# Patient Record
Sex: Female | Born: 1980 | Race: White | Hispanic: No | Marital: Married | Smoking: Former smoker
Health system: Southern US, Community
[De-identification: ages and names within clinical notes are randomized; demographics above are authoritative.]

## PROBLEM LIST (undated history)

## (undated) DIAGNOSIS — I82409 Acute embolism and thrombosis of unspecified deep veins of unspecified lower extremity: Secondary | ICD-10-CM

## (undated) DIAGNOSIS — N83519 Torsion of ovary and ovarian pedicle, unspecified side: Secondary | ICD-10-CM

## (undated) DIAGNOSIS — M329 Systemic lupus erythematosus, unspecified: Secondary | ICD-10-CM

## (undated) DIAGNOSIS — N200 Calculus of kidney: Secondary | ICD-10-CM

## (undated) DIAGNOSIS — D6861 Antiphospholipid syndrome: Secondary | ICD-10-CM

## (undated) DIAGNOSIS — IMO0002 Reserved for concepts with insufficient information to code with codable children: Secondary | ICD-10-CM

## (undated) DIAGNOSIS — I2692 Saddle embolus of pulmonary artery without acute cor pulmonale: Secondary | ICD-10-CM

## (undated) DIAGNOSIS — E063 Autoimmune thyroiditis: Secondary | ICD-10-CM

## (undated) DIAGNOSIS — Z8739 Personal history of other diseases of the musculoskeletal system and connective tissue: Secondary | ICD-10-CM

## (undated) DIAGNOSIS — E079 Disorder of thyroid, unspecified: Secondary | ICD-10-CM

## (undated) HISTORY — PX: OVARY SURGERY: SHX727

## (undated) HISTORY — PX: ABLATION ON ENDOMETRIOSIS: SHX5787

## (undated) HISTORY — PX: BACK SURGERY: SHX140

## (undated) HISTORY — PX: NECK SURGERY: SHX720

## (undated) HISTORY — PX: CHOLECYSTECTOMY: SHX55

## (undated) HISTORY — PX: TONSILLECTOMY: SUR1361

---

## 2000-09-02 ENCOUNTER — Encounter: Payer: Self-pay | Admitting: Family Medicine

## 2000-09-02 ENCOUNTER — Encounter: Admission: RE | Admit: 2000-09-02 | Discharge: 2000-09-02 | Payer: Self-pay | Admitting: Family Medicine

## 2000-10-17 ENCOUNTER — Emergency Department (HOSPITAL_COMMUNITY): Admission: EM | Admit: 2000-10-17 | Discharge: 2000-10-17 | Payer: Self-pay | Admitting: Emergency Medicine

## 2000-10-19 ENCOUNTER — Ambulatory Visit (HOSPITAL_COMMUNITY): Admission: RE | Admit: 2000-10-19 | Discharge: 2000-10-19 | Payer: Self-pay | Admitting: Gastroenterology

## 2000-10-21 ENCOUNTER — Encounter: Payer: Self-pay | Admitting: Gastroenterology

## 2000-10-21 ENCOUNTER — Ambulatory Visit (HOSPITAL_COMMUNITY): Admission: RE | Admit: 2000-10-21 | Discharge: 2000-10-21 | Payer: Self-pay | Admitting: Gastroenterology

## 2000-10-22 ENCOUNTER — Inpatient Hospital Stay (HOSPITAL_COMMUNITY): Admission: EM | Admit: 2000-10-22 | Discharge: 2000-10-23 | Payer: Self-pay | Admitting: Emergency Medicine

## 2000-10-22 ENCOUNTER — Encounter: Payer: Self-pay | Admitting: Gastroenterology

## 2005-07-31 ENCOUNTER — Ambulatory Visit: Payer: Self-pay | Admitting: *Deleted

## 2015-09-22 DIAGNOSIS — E063 Autoimmune thyroiditis: Secondary | ICD-10-CM

## 2015-09-22 HISTORY — DX: Autoimmune thyroiditis: E06.3

## 2016-10-20 ENCOUNTER — Encounter (HOSPITAL_BASED_OUTPATIENT_CLINIC_OR_DEPARTMENT_OTHER): Payer: Self-pay

## 2016-10-20 ENCOUNTER — Emergency Department (HOSPITAL_BASED_OUTPATIENT_CLINIC_OR_DEPARTMENT_OTHER)
Admission: EM | Admit: 2016-10-20 | Discharge: 2016-10-21 | Disposition: A | Discharge status: Home / Self Care | Payer: BLUE CROSS/BLUE SHIELD | Attending: Emergency Medicine | Admitting: Emergency Medicine

## 2016-10-20 ENCOUNTER — Emergency Department (HOSPITAL_BASED_OUTPATIENT_CLINIC_OR_DEPARTMENT_OTHER): Payer: BLUE CROSS/BLUE SHIELD

## 2016-10-20 DIAGNOSIS — R0602 Shortness of breath: Secondary | ICD-10-CM | POA: Insufficient documentation

## 2016-10-20 DIAGNOSIS — Z87891 Personal history of nicotine dependence: Secondary | ICD-10-CM | POA: Insufficient documentation

## 2016-10-20 DIAGNOSIS — Z79899 Other long term (current) drug therapy: Secondary | ICD-10-CM | POA: Diagnosis not present

## 2016-10-20 DIAGNOSIS — R109 Unspecified abdominal pain: Secondary | ICD-10-CM | POA: Diagnosis present

## 2016-10-20 DIAGNOSIS — N390 Urinary tract infection, site not specified: Secondary | ICD-10-CM | POA: Insufficient documentation

## 2016-10-20 HISTORY — DX: Torsion of ovary and ovarian pedicle, unspecified side: N83.519

## 2016-10-20 HISTORY — DX: Personal history of other diseases of the musculoskeletal system and connective tissue: Z87.39

## 2016-10-20 HISTORY — DX: Calculus of kidney: N20.0

## 2016-10-20 LAB — URINALYSIS, ROUTINE W REFLEX MICROSCOPIC
Bilirubin Urine: NEGATIVE
Glucose, UA: NEGATIVE mg/dL
Ketones, ur: NEGATIVE mg/dL
Nitrite: NEGATIVE
Protein, ur: 30 mg/dL — AB
Specific Gravity, Urine: 1.016 (ref 1.005–1.030)
pH: 7 (ref 5.0–8.0)

## 2016-10-20 LAB — PREGNANCY, URINE: Preg Test, Ur: NEGATIVE

## 2016-10-20 LAB — URINALYSIS, MICROSCOPIC (REFLEX)

## 2016-10-20 MED ORDER — ONDANSETRON HCL 4 MG/2ML IJ SOLN
4.0000 mg | Freq: Once | INTRAMUSCULAR | Status: AC
  Administered 2016-10-20: 4 mg via INTRAVENOUS
  Filled 2016-10-20: qty 2

## 2016-10-20 MED ORDER — KETOROLAC TROMETHAMINE 30 MG/ML IJ SOLN
30.0000 mg | Freq: Once | INTRAMUSCULAR | Status: AC
  Administered 2016-10-20: 30 mg via INTRAVENOUS
  Filled 2016-10-20: qty 1

## 2016-10-20 NOTE — ED Provider Notes (Signed)
[facility] Provider Note   CSN: [id number] Arrival date & time: 10/20/16  2255  By signing my name below, I, Nelwyn Salisbury, attest that this documentation has been prepared under the direction and in the presence of Glynn Octave, MD . Electronically Signed: Nelwyn Salisbury, Scribe. 10/20/2016. 11:17 PM.  History   Chief Complaint Chief Complaint  Patient presents with  . Flank Pain   HPI  HPI Comments:  Katrina Osborne is a 36 y.o. female with pmhx of kidney stones who presents to the Emergency Department complaining of sudden-onset, constant right flank. Pt describes her pain as pressure-like, beginning in her right flank and moving through the right side of her ribs and abdomen. Her pain is exacerbated on deep inhalation and by leaning to her affected side. She reports associated SOB, dysuria and diarrhea. Pt was seen recently by UC with a cough and SOB and diagnosed with bronchitis. She has been taking her prescribed abx with relief of her cough. Pt denies any hematuria, vomiting, fever, or appetite change.  She is currently on her period. Previous abdominal shx of cholecystectomy. She notes that she recently noticed an unexplained bruise on her lower extremities and that her feet feel colder than normal.  Past Medical History:  Diagnosis Date  . History of herniated intervertebral disc   . Kidney stone   . Ovarian torsion     There are no active problems to display for this patient.   Past Surgical History:  Procedure Laterality Date  . CHOLECYSTECTOMY    . TONSILLECTOMY      OB History    No data available       Home Medications    Prior to Admission medications   Medication Sig Start Date End Date Taking? Authorizing Provider  HYDROcodone-acetaminophen (NORCO/VICODIN) 5-325 MG tablet Take 1 tablet by mouth every 6 (six) hours as needed for moderate pain.   Yes Historical Provider, MD  omeprazole (PRILOSEC) 20 MG capsule Take 20 mg by mouth  daily.   Yes Historical Provider, MD    Family History No family history on file.  Social History Social History  Substance Use Topics  . Smoking status: Former Games developer  . Smokeless tobacco: Never Used  . Alcohol use No     Allergies   Morphine and related and Penicillins   Review of Systems Review of Systems  Constitutional: Negative for appetite change and fever.  Respiratory: Positive for shortness of breath.   Gastrointestinal: Positive for diarrhea. Negative for vomiting.  Genitourinary: Positive for dysuria and flank pain. Negative for hematuria.  All other systems reviewed and are negative.    Physical Exam Updated Vital Signs BP 109/90 (BP Location: Left Arm)   Pulse 80   Temp 98 F (36.7 C) (Oral)   Resp 19   Ht 5\' 6"  (1.676 m)   Wt 235 lb (106.6 kg)   LMP 10/18/2016   SpO2 99%   BMI 37.93 kg/m   Physical Exam  Constitutional: She is oriented to person, place, and time. She appears well-developed and well-nourished. No distress.  Uncomfortabel  HENT:  Head: Normocephalic and atraumatic.  Mouth/Throat: Oropharynx is clear and moist. No oropharyngeal exudate.  Eyes: Conjunctivae and EOM are normal. Pupils are equal, round, and reactive to light.  Neck: Normal range of motion. Neck supple.  No meningismus.  Cardiovascular: Normal rate, regular rhythm, normal heart sounds and intact distal pulses.   No murmur heard. Lateral rib tenderness  Pulmonary/Chest: Effort normal and breath  sounds normal. No respiratory distress. She exhibits tenderness.  Abdominal: Soft. There is tenderness. There is no rebound and no guarding.  Right flank tenderness. Mild right sided abdominal tenderness.  Musculoskeletal: Normal range of motion. She exhibits no edema or tenderness.  Neurological: She is alert and oriented to person, place, and time. No cranial nerve deficit. She exhibits normal muscle tone. Coordination normal.   5/5 strength throughout. CN 2-12 intact.Equal  grip strength.   Skin: Skin is warm.  Psychiatric: She has a normal mood and affect. Her behavior is normal.  Nursing note and vitals reviewed.    ED Treatments / Results  DIAGNOSTIC STUDIES:  Oxygen Saturation is 99% on RA, normal by my interpretation.    COORDINATION OF CARE:  11:23 PM Discussed treatment plan with pt at bedside which includes urinalysis and pt agreed to plan.  Labs (all labs ordered are listed, but only abnormal results are displayed) Labs Reviewed  URINALYSIS, ROUTINE W REFLEX MICROSCOPIC - Abnormal; Notable for the following:       Result Value   Hgb urine dipstick LARGE (*)    Protein, ur 30 (*)    Leukocytes, UA MODERATE (*)    All other components within normal limits  COMPREHENSIVE METABOLIC PANEL - Abnormal; Notable for the following:    Potassium 3.3 (*)    Glucose, Bld 104 (*)    ALT 11 (*)    All other components within normal limits  URINALYSIS, MICROSCOPIC (REFLEX) - Abnormal; Notable for the following:    Bacteria, UA FEW (*)    Squamous Epithelial / LPF 0-5 (*)    All other components within normal limits  URINE CULTURE  PREGNANCY, URINE  CBC WITH DIFFERENTIAL/PLATELET  LIPASE, BLOOD  D-DIMER, QUANTITATIVE (NOT AT [facility])    EKG  EKG Interpretation None       Radiology Dg Chest 2 View  Result Date: 10/20/2016 CLINICAL DATA:  Right lower rib pain for 3 days.  Frequent coughing. EXAM: CHEST  2 VIEW COMPARISON:  None. FINDINGS: The heart size and mediastinal contours are within normal limits. Both lungs are clear. The visualized skeletal structures are unremarkable. IMPRESSION: No active cardiopulmonary disease. Electronically Signed   By: Ellery Plunk M.D.   On: 10/20/2016 23:54   Ct Renal Stone Study  Result Date: 10/21/2016 CLINICAL DATA:  Acute onset of right flank pain, extending to the right upper and lower quadrants. Hematuria. Initial encounter. EXAM: CT ABDOMEN AND PELVIS WITHOUT CONTRAST TECHNIQUE: Multidetector CT  imaging of the abdomen and pelvis was performed following the standard protocol without IV contrast. COMPARISON:  None. FINDINGS: Lower chest: The visualized lung bases are grossly clear. The visualized portions of the mediastinum are unremarkable. Hepatobiliary: The liver is unremarkable in appearance. The patient is status post cholecystectomy, with clips noted at the gallbladder fossa. The common bile duct remains normal in caliber. Pancreas: The pancreas is within normal limits. Spleen: The spleen is unremarkable in appearance. Adrenals/Urinary Tract: The adrenal glands are unremarkable in appearance. The kidneys are within normal limits. There is no evidence of hydronephrosis. No renal or ureteral stones are identified. Mild left-sided perinephric stranding is noted. Stomach/Bowel: The stomach is unremarkable in appearance. The small bowel is within normal limits. The appendix is normal in caliber, without evidence of appendicitis. The colon is unremarkable in appearance. Vascular/Lymphatic: The abdominal aorta is unremarkable in appearance. The inferior vena cava is grossly unremarkable. No retroperitoneal lymphadenopathy is seen. No pelvic sidewall lymphadenopathy is identified. Reproductive: The bladder is  mildly distended and within normal limits. The uterus is grossly unremarkable in appearance. The ovaries are relatively symmetric. No suspicious adnexal masses are seen. Other: No additional soft tissue abnormalities are seen. Musculoskeletal: No acute osseous abnormalities are identified. Mild degenerative change is noted at L3-L4, with disc space narrowing and disc osteophyte complexes. The visualized musculature is unremarkable in appearance. IMPRESSION: No acute abnormality seen within the abdomen or pelvis. No evidence of hydronephrosis. No renal or ureteral stones seen at this time. Electronically Signed   By: Roanna RaiderJeffery  Chang M.D.   On: 10/21/2016 01:40    Procedures Procedures (including critical  care time)  Medications Ordered in ED Medications - No data to display   Initial Impression / Assessment and Plan / ED Course  I have reviewed the triage vital signs and the nursing notes.  Pertinent labs & imaging results that were available during my care of the patient were reviewed by me and considered in my medical decision making (see chart for details).     Flank pain that is pleuritic.  Hematuria but on menstrual cycle.   UA with RBCs and WBCs.  HCG negative. Will send culture.  CXR negative.  D-dimer negative, doubt PE. CT shows no kidney stone. No other acute pathology.   Rocephin given for UTI, culture sent.  Treat for UTI, possible pyelonephritis.  May have passed kidney stone. Also consider soreness from coughing and recent URI.  Consider zoster with rash not yet visible.  Pain improved. Followup with PCP.  Continue antibiotics. Return precautions discussed.   Final Clinical Impressions(s) / ED Diagnoses   Final diagnoses:  Flank pain  Urinary tract infection without hematuria, site unspecified    New Prescriptions New Prescriptions   No medications on file  I personally performed the services described in this documentation, which was scribed in my presence. The recorded information has been reviewed and is accurate.     Glynn OctaveStephen Rajanee Schuelke, MD 10/21/16 [phone]

## 2016-10-20 NOTE — ED Triage Notes (Signed)
Pt had bronchitis 5 days ago and has had back pain since then from coughing, but yesterday the pain got much worse with nausea and pain radiating to RLQ.  At that time she was having difficulty urinating.  Today her pain moved into her RUQ, right chest as well.  Pt is able to speak in full sentences, no SOB with walking.

## 2016-10-21 ENCOUNTER — Emergency Department (HOSPITAL_BASED_OUTPATIENT_CLINIC_OR_DEPARTMENT_OTHER): Payer: BLUE CROSS/BLUE SHIELD

## 2016-10-21 LAB — CBC WITH DIFFERENTIAL/PLATELET
BASOS ABS: 0.1 10*3/uL (ref 0.0–0.1)
BASOS PCT: 1 %
EOS ABS: 0.1 10*3/uL (ref 0.0–0.7)
Eosinophils Relative: 2 %
HCT: 36.3 % (ref 36.0–46.0)
HEMOGLOBIN: 12.4 g/dL (ref 12.0–15.0)
Lymphocytes Relative: 32 %
Lymphs Abs: 2.9 10*3/uL (ref 0.7–4.0)
MCH: 28.4 pg (ref 26.0–34.0)
MCHC: 34.2 g/dL (ref 30.0–36.0)
MCV: 83.1 fL (ref 78.0–100.0)
MONOS PCT: 11 %
Monocytes Absolute: 0.9 10*3/uL (ref 0.1–1.0)
NEUTROS PCT: 54 %
Neutro Abs: 4.8 10*3/uL (ref 1.7–7.7)
Platelets: 208 10*3/uL (ref 150–400)
RBC: 4.37 MIL/uL (ref 3.87–5.11)
RDW: 13 % (ref 11.5–15.5)
WBC: 8.8 10*3/uL (ref 4.0–10.5)

## 2016-10-21 LAB — COMPREHENSIVE METABOLIC PANEL
ALBUMIN: 3.9 g/dL (ref 3.5–5.0)
ALK PHOS: 64 U/L (ref 38–126)
ALT: 11 U/L — ABNORMAL LOW (ref 14–54)
ANION GAP: 8 (ref 5–15)
AST: 19 U/L (ref 15–41)
BUN: 12 mg/dL (ref 6–20)
CO2: 22 mmol/L (ref 22–32)
Calcium: 8.9 mg/dL (ref 8.9–10.3)
Chloride: 109 mmol/L (ref 101–111)
Creatinine, Ser: 0.8 mg/dL (ref 0.44–1.00)
GFR calc Af Amer: >60 mL/min (ref >60)
GFR calc non Af Amer: >60 mL/min (ref >60)
GLUCOSE: 104 mg/dL — AB (ref 65–99)
POTASSIUM: 3.3 mmol/L — AB (ref 3.5–5.1)
SODIUM: 139 mmol/L (ref 135–145)
Total Bilirubin: 0.5 mg/dL (ref 0.3–1.2)
Total Protein: 7.1 g/dL (ref 6.5–8.1)

## 2016-10-21 LAB — D-DIMER, QUANTITATIVE: D-Dimer, Quant: 0.42 ug/mL-FEU (ref 0.00–0.50)

## 2016-10-21 LAB — LIPASE, BLOOD: Lipase: 33 U/L (ref 11–51)

## 2016-10-21 MED ORDER — DEXTROSE 5 % IV SOLN
1.0000 g | Freq: Once | INTRAVENOUS | Status: AC
  Administered 2016-10-21: 1 g via INTRAVENOUS
  Filled 2016-10-21: qty 10

## 2016-10-21 MED ORDER — CEPHALEXIN 500 MG PO CAPS: 500.0000 mg | ORAL_CAPSULE | Freq: Four times a day (QID) | ORAL | 0 refills | Status: DC

## 2016-10-21 MED ORDER — NAPROXEN 500 MG PO TABS: 500.0000 mg | ORAL_TABLET | Freq: Two times a day (BID) | ORAL | 0 refills | Status: DC

## 2016-10-21 NOTE — ED Notes (Signed)
Patient transported to CT 

## 2016-10-21 NOTE — ED Notes (Signed)
Pt verbalizes understanding of d/c instructions and denies any further need at this time. 

## 2016-10-21 NOTE — Discharge Instructions (Signed)
There is no pneumonia, blood clot or kidney stone. Take the antibiotics for a UTI. Watch for development of a rash on your skin as this could be early shingles. Return to the ED if you develop new or worsening symptoms.

## 2016-10-22 LAB — URINE CULTURE: Culture: <10000 — AB

## 2017-04-19 ENCOUNTER — Encounter: Payer: Self-pay | Admitting: Physical Therapy

## 2017-04-19 ENCOUNTER — Ambulatory Visit: Payer: Commercial Managed Care - PPO | Attending: Neurological Surgery | Admitting: Physical Therapy

## 2017-04-19 DIAGNOSIS — M545 Low back pain, unspecified: Secondary | ICD-10-CM

## 2017-04-19 NOTE — Therapy (Signed)
[facility] [address] [place], [place], [address] Phone: [phone]   Fax:  [phone]  Physical Therapy Evaluation  Patient Details  Name: Katrina Osborne MRN: [id number] Date of Birth: 02/08/1981 Referring Provider: Gae DryGregory Howes MD  Encounter Date: 04/19/2017      PT End of Session - 04/19/17 1339    Visit Number 1   Number of Visits 12   Date for PT Re-Evaluation 05/31/17   PT Start Time 0101   PT Stop Time 0151   PT Time Calculation (min) 50 min   Activity Tolerance Patient tolerated treatment well   Behavior During Therapy [facility] for tasks assessed/performed      Past Medical History:  Diagnosis Date  . History of herniated intervertebral disc   . Kidney stone   . Ovarian torsion     Past Surgical History:  Procedure Laterality Date  . CHOLECYSTECTOMY    . TONSILLECTOMY      There were no vitals filed for this visit.       Subjective Assessment - 04/19/17 1331    Subjective The patient presents to OPPT s/p lumbar surgery performed on 02/22/17.  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  She has tried to do some yardwork but this increased her pain.  Her pain at rest today is a 2/10.     Pertinent History H/o low back pain.   Patient Stated Goals Get out of pain and get strong again.   Currently in Pain? Yes   Pain Score 2    Pain Location Back   Pain Orientation Right   Pain Descriptors / Indicators Aching   Pain Type Surgical pain   Pain Onset More than a month ago   Pain Frequency Constant   Aggravating Factors  See above.   Pain Relieving Factors See above.            [facility] PT Assessment - 04/19/17 0001      Assessment   Medical Diagnosis Lumbar surgery L3 to L5.   Referring Provider Gae DryGregory Howes MD   Onset Date/Surgical Date --  02/22/17 (surgery date).     Precautions   Precautions --  Pain-free spinal exercises.  No heat.     Restrictions    Weight Bearing Restrictions No     Balance Screen   Has the patient fallen in the past 6 months Yes   How many times? --  1.   Has the patient had a decrease in activity level because of a fear of falling?  No   Is the patient reluctant to leave their home because of a fear of falling?  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     Strength   Overall Strength Comments Normal bilateral LE strength.     Palpation   Palpation comment Tender with increased tone on either side of the patient's lumbar incision.     Special Tests    Special Tests --  =leg lengths.  Absent bilateral LE DTR's.     Transfers   Transfers --  WNL.     Ambulation/Gait   Gait Comments WNL.            Objective measurements completed on examination: See above findings.          [facility] Adult PT Treatment/Exercise - 04/19/17 0001      Modalities   Modalities Electrical Stimulation;Moist Heat     Moist Heat Therapy   Number Minutes Moist Heat 15 Minutes   Moist Heat Location Lumbar Spine     Electrical Stimulation   Electrical Stimulation Location Bilateral lumbar.   Electrical Stimulation Action Pre-mod.   Electrical Stimulation Parameters 80-150 Hz x 15 minutes.   Electrical Stimulation Goals Tone;Pain                             Plan - 04/19/17 1339    Clinical Impression Statement The patient is s/p lumbar surgery at level 3 to 5.  She is doing well but reports continued pain with ADL's.  Patient is pleased with surgery as it has decreased her right LE pain.  Patient will benefit from skilled physical therapy to include a comprehensive core stabilization program.   History  and Personal Factors relevant to plan of care: h/o low back pain.   Clinical Presentation Stable   Clinical Presentation due to: Good surgical outcome.   Clinical Decision Making Low   Rehab Potential Excellent   PT Frequency 2x / week   PT Duration 6 weeks   PT Treatment/Interventions ADLs/Self Care Home Management;Cryotherapy;Electrical Stimulation;Ultrasound;Therapeutic activities;Therapeutic exercise;Neuromuscular re-education;Patient/family education;Manual techniques   PT Next Visit Plan No heat.  Please see lumbar discectomy protocol.  e'stim.   Consulted and Agree with Plan of Care Patient      Patient will benefit from skilled therapeutic intervention in order to improve the following deficits and impairments:  Pain, Decreased activity tolerance  Visit Diagnosis: Acute bilateral low back pain without sciatica - Plan: PT plan of care cert/re-cert     Problem List There are no active problems to display for this patient.   Keanthony Poole, [place] MPT 04/19/2017, 2:10 PM  [facility] [address] [place], [place], [address] Phone: [phone]   Fax:  [phone]  Name: Katrina Osborne MRN: [id number] Date of Birth: 11/26/1980

## 2017-04-21 ENCOUNTER — Encounter: Payer: BLUE CROSS/BLUE SHIELD | Admitting: Physical Therapy

## 2017-04-23 ENCOUNTER — Ambulatory Visit: Payer: Commercial Managed Care - PPO | Attending: Neurological Surgery | Admitting: *Deleted

## 2017-04-23 DIAGNOSIS — M545 Low back pain, unspecified: Secondary | ICD-10-CM

## 2017-04-23 NOTE — Therapy (Signed)
[facility] [address] [place], [place], [address] Phone: [phone]   Fax:  [phone]  Physical Therapy Treatment  Patient Details  Name: Katrina Osborne MRN: [id number] Date of Birth: 11/22/1980 Referring Provider: Gae DryGregory Howes MD  Encounter Date: 04/23/2017      PT End of Session - 04/23/17 1037    Visit Number 2   Number of Visits 12   Date for PT Re-Evaluation 05/31/17   PT Start Time 1030   PT Stop Time 1120   PT Time Calculation (min) 50 min      Past Medical History:  Diagnosis Date  . History of herniated intervertebral disc   . Kidney stone   . Ovarian torsion     Past Surgical History:  Procedure Laterality Date  . CHOLECYSTECTOMY    . TONSILLECTOMY      There were no vitals filed for this visit.                       [facility] Adult PT Treatment/Exercise - 04/23/17 0001      Exercises   Exercises Lumbar;Knee/Hip     Lumbar Exercises: Supine   Ab Set 20 reps;5 seconds   Bent Knee Raise 10 reps;3 seconds  with drawin   Bridge 10 reps  with drawin     Lumbar Exercises: Prone   Straight Leg Raise 10 reps;2 seconds  with drawin     Modalities   Modalities Electrical Stimulation;Moist Heat     Electrical Stimulation   Electrical Stimulation Location Bilateral lumbar.Premod x 15 mins 80-150hz   Try  IFC next Rx   Electrical Stimulation Goals Tone;Pain       Bike x 10 mins L1           PT Education - 04/23/17 1049    Education provided Yes                    Plan - 04/23/17 1039    Clinical Impression Statement Pt arrived today doing fairly well with mainly LB soreness. Rx focused on core activation exs and an HEP was provided. Pt did well with  therex with  prone leg raise being the most challenging. Pt had a normal response to Estim, but IFC may be more tolerable.   Clinical Decision Making Low   Rehab Potential Excellent   PT Frequency 2x / week   PT Duration 6 weeks   PT Treatment/Interventions ADLs/Self Care Home Management;Cryotherapy;Electrical Stimulation;Ultrasound;Therapeutic activities;Therapeutic exercise;Neuromuscular re-education;Patient/family education;Manual techniques   PT Next Visit Plan No heat.  Please see lumbar discectomy protocol.  e'stim.  IFC   Consulted and Agree with Plan of Care Patient      Patient will benefit from skilled therapeutic intervention in order to improve the following deficits and impairments:  Pain, Decreased activity tolerance  Visit Diagnosis: Acute bilateral low back pain without sciatica     Problem List There are no active problems to display for this patient.   Brycelyn Gambino,CHRIS, PTA 04/23/2017, 12:44 PM  [facility] [address] [place], [place], [address] Phone: [phone]   Fax:  [phone]  Name: Katrina Osborne MRN: [id number] Date of Birth: 12/21/1980

## 2017-04-23 NOTE — Patient Instructions (Addendum)
Isometric Abdominal   Lying on back with knees bent, tighten stomach by pulling navel down. Hold ____ seconds. Repeat __5__ times per set. Do __5__ sets per session. Do _3-5___ sessions per day.  http://orth.exer.us/1086   Copyright  VHI. All rights reserved.  Bent Leg Lift (Hook-Lying)   Tighten stomach and slowly raise right leg _6___ inches from floor. Keep trunk rigid. Hold _2-3___ seconds. Repeat _10___ times per set. Do _3___ sets per session. Do _2-3___ sessions per day.  http://orth.exer.us/1090   Copyright  VHI. All rights reserved.  Bridging   Slowly raise buttocks from floor, keeping stomach tight. Repeat __10__ times per set. Do _3___ sets per session. Do __2-3__ sessions per day.  http://orth.exer.us/1096   Copyright  VHI. All rights reserved.  Isometric Abdominal   Lying on back with knees bent, tighten stomach by pulling navel down. Hold ____ seconds. Repeat __5__ times per set. Do __5__ sets per session. Do _3-5___ sessions per day.  http://orth.exer.us/1086   Copyright  VHI. All rights reserved.  Bent Leg Lift (Hook-Lying)   Tighten stomach and slowly raise right leg _6___ inches from floor. Keep trunk rigid. Hold _2-3___ seconds. Repeat _10___ times per set. Do _3___ sets per session. Do _2-3___ sessions per day.  http://orth.exer.us/1090   Copyright  VHI. All rights reserved.  Bridging   Slowly raise buttocks from floor, keeping stomach tight. Repeat __10__ times per set. Do _3___ sets per session. Do __2-3__ sessions per day.  http://orth.exer.us/1096   Copyright  VHI. All rights reserved.  Bridging   Slowly raise buttocks from floor, keeping stomach tight. Repeat ____ times per set. Do ____ sets per session. Do ____ sessions per day.  http://orth.exer.us/1096   Copyright  VHI. All rights reserved.  Straight Leg Raise (Prone)   Abdomen and head supported, keep left knee locked and raise leg at hip. Avoid arching low  back. Repeat _10___ times per set. Do _3___ sets per session. Do _2-3___ sessions per day.  http://orth.exer.us/1112   Copyright  VHI. All rights reserved.  Opposite Arm / Leg Lift (Prone)   Abdomen and head supported, left knee locked, raise leg and opposite arm ____ inches from floor. Repeat _10___ times per set. Do 3____ sets per session. Do __2-3__ sessions per day.  http://orth.exer.us/1114   Copyright  VHI. All rights reserved.  Combination (Hook-Lying)   Tighten stomach and slowly raise left leg and lower opposite arm over head. Keep trunk rigid. Repeat _10___ times per set. Do _3___ sets per session. Do _2-3___ sessions per day.  http://orth.exer.us/1092   Copyright  VHI. All rights reserved.  Upper / Lower Extremity Extension (All-Fours)   Tighten stomach and raise right leg and opposite arm. Keep trunk rigid. Repeat __10__ times per set. Do __3__ sets per session. Do _2-3___ sessions per day.  http://orth.exer.us/1118   Copyright  VHI. All rights reserved.

## 2017-04-27 ENCOUNTER — Ambulatory Visit: Payer: Commercial Managed Care - PPO | Admitting: *Deleted

## 2017-04-27 DIAGNOSIS — M545 Low back pain, unspecified: Secondary | ICD-10-CM

## 2017-04-27 NOTE — Therapy (Signed)
[facility] [address] [place], [place], [address] Phone: [phone]   Fax:  [phone]  Physical Therapy Treatment  Patient Details  Name: Katrina Osborne MRN: [id number] Date of Birth: [birth year] Referring Provider: Gae Dry MD  Encounter Date: 04/27/2017      PT End of Session - 04/27/17 1047    Visit Number 3   Number of Visits 12   Date for PT Re-Evaluation 05/31/17   PT Start Time 1032   PT Stop Time 1123   PT Time Calculation (min) 51 min      Past Medical History:  Diagnosis Date  . History of herniated intervertebral disc   . Kidney stone   . Ovarian torsion     Past Surgical History:  Procedure Laterality Date  . CHOLECYSTECTOMY    . TONSILLECTOMY      There were no vitals filed for this visit.      Subjective Assessment - 04/27/17 1039    Subjective The patient presents to OPPT s/p lumbar surgery performed on 02/22/17.  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  She has tried to do some yardwork but this increased her pain.  Her pain at rest today is a 2/10.     Pertinent History H/o low back pain.   Patient Stated Goals Get out of pain and get strong again.   Currently in Pain? Yes   Pain Score 3    Pain Location Back   Pain Orientation Right   Pain Descriptors / Indicators Aching   Pain Type Surgical pain   Pain Onset More than a month ago                         [facility] Adult PT Treatment/Exercise - 04/27/17 0001      Exercises   Exercises Lumbar;Knee/Hip     Lumbar Exercises: Supine   Ab Set 20 reps;5 seconds   Bent Knee Raise 10 reps;3 seconds  with drawin   Bridge 10 reps  with drawin     Lumbar Exercises: Prone   Straight Leg Raise 10 reps;2 seconds  with drawin     Lumbar Exercises: Quadruped   Madcat/Old Horse 10 reps     Knee/Hip Exercises: Standing   Rocker Board 5 minutes  DF/PF, calf stretch,  balance     Knee/Hip Exercises: Seated   Sit to Sand 10 reps  power lift with Red ball for technique     Modalities   Modalities Electrical Stimulation;Moist Heat     Electrical Stimulation   Electrical Stimulation Location Bilateral lumbar.Premod x 15 mins 80-150hz    Electrical Stimulation Goals Tone;Pain                            Plan - 04/27/17 1048    Clinical Impression Statement Pt arrived today doing fairly well just soreness in LB. HEP was reviewed with verbal and tactile cues needed. Cat and Camel were performed and added to HEP. The hinge bend and power position were also performed. Pt needed verbal and tactile cues to help with hinge bending.   Rehab Potential Excellent   PT Frequency 2x / week   PT Duration 6 weeks   PT Treatment/Interventions ADLs/Self Care Home Management;Cryotherapy;Electrical Stimulation;Ultrasound;Therapeutic activities;Therapeutic exercise;Neuromuscular re-education;Patient/family education;Manual techniques   PT Next Visit Plan No heat.  Please see lumbar  discectomy protocol.  e'stim.  IFC   Consulted and Agree with Plan of Care Patient      Patient will benefit from skilled therapeutic intervention in order to improve the following deficits and impairments:  Pain, Decreased activity tolerance  Visit Diagnosis: Acute bilateral low back pain without sciatica     Problem List There are no active problems to display for this patient.   Frona Yost,CHRIS, PTA 04/27/2017, 12:14 PM  [facility] [address] [place], [place], [address] Phone: [phone]   Fax:  [phone]  Name: Cecilie LowersCynthia Michele Limpert MRN: [id number] Date of Birth: 09/04/1981

## 2017-04-29 ENCOUNTER — Encounter: Payer: BLUE CROSS/BLUE SHIELD | Admitting: *Deleted

## 2017-04-30 ENCOUNTER — Ambulatory Visit: Payer: Commercial Managed Care - PPO | Admitting: *Deleted

## 2017-04-30 DIAGNOSIS — M545 Low back pain, unspecified: Secondary | ICD-10-CM

## 2017-04-30 NOTE — Therapy (Signed)
[facility] [address] [place], [place], [address] Phone: [phone]   Fax:  [phone]  Physical Therapy Treatment  Patient Details  Name: Katrina Osborne MRN: [id number] Date of Birth: [birth year] Referring Provider: Gae Dry MD  Encounter Date: 04/30/2017      PT End of Session - 04/30/17 1038    Visit Number 4   Number of Visits 12   Date for PT Re-Evaluation 05/31/17   PT Start Time 1032   PT Stop Time 1119   PT Time Calculation (min) 47 min      Past Medical History:  Diagnosis Date  . History of herniated intervertebral disc   . Kidney stone   . Ovarian torsion     Past Surgical History:  Procedure Laterality Date  . CHOLECYSTECTOMY    . TONSILLECTOMY      There were no vitals filed for this visit.      Subjective Assessment - 04/30/17 1033    Subjective LBP doing ok and did well after last Rx. Not feeling good due to respirtory problems.   Pertinent History H/o low back pain.   Patient Stated Goals Get out of pain and get strong again.   Currently in Pain? Yes   Pain Score 2    Pain Location Back   Pain Orientation Right   Pain Descriptors / Indicators Aching   Pain Type Surgical pain   Pain Onset More than a month ago   Pain Frequency Constant                         [facility] Adult PT Treatment/Exercise - 04/30/17 0001      Exercises   Exercises Lumbar;Knee/Hip     Lumbar Exercises: Standing   Shoulder Extension AROM;Both;20 reps  XTS pink   core activation     Lumbar Exercises: Supine   Ab Set 10 reps;5 seconds   Bent Knee Raise 10 reps;3 seconds  with drawin   Bridge 10 reps  with drawin     Lumbar Exercises: Prone   Straight Leg Raise 10 reps;2 seconds     Lumbar Exercises: Quadruped   Madcat/Old Horse 10 reps     Knee/Hip Exercises: Seated   Sit to Sand 20 reps  power lift with Red ball for technique     Modalities   Modalities Electrical  Stimulation;Moist Heat     Electrical Stimulation   Electrical Stimulation Location Bilateral lumbar.Premod x 15 mins 80-150hz    Electrical Stimulation Goals Tone;Pain                            Plan - 04/30/17 1238    Clinical Impression Statement Pt arrived today not feeling well due to respiratory problems. She did well with core activation exs and had less pain. Prone leg raise was easier , but still the most challenging especially RT side. We also reviewed the power position and hinge bending, for body mechanics. She still requires verbal and tactile cues for technique.Marland Kitchen STGs ongoing   Clinical Presentation Stable   Clinical Decision Making Low   Rehab Potential Excellent   PT Frequency 2x / week   PT Duration 6 weeks   PT Treatment/Interventions ADLs/Self Care Home Management;Cryotherapy;Electrical Stimulation;Ultrasound;Therapeutic activities;Therapeutic exercise;Neuromuscular re-education;Patient/family education;Manual techniques   PT Next Visit Plan No heat.  Please see lumbar discectomy protocol.  e'stim.  IFC   Consulted and Agree with Plan of  Care Patient      Patient will benefit from skilled therapeutic intervention in order to improve the following deficits and impairments:  Pain, Decreased activity tolerance  Visit Diagnosis: Acute bilateral low back pain without sciatica     Problem List There are no active problems to display for this patient.   Kelicia Youtz,CHRIS, PTA 04/30/2017, 12:49 PM  [facility] [address] [place], [place], [address] Phone: [phone]   Fax:  [phone]  Name: Cecilie LowersCynthia Michele Lichtenwalner MRN: [id number] Date of Birth: 05/16/1981

## 2017-05-04 ENCOUNTER — Encounter: Payer: BLUE CROSS/BLUE SHIELD | Admitting: *Deleted

## 2017-05-06 ENCOUNTER — Encounter: Payer: BLUE CROSS/BLUE SHIELD | Admitting: *Deleted

## 2017-05-07 ENCOUNTER — Ambulatory Visit: Payer: Commercial Managed Care - PPO | Admitting: *Deleted

## 2017-05-10 ENCOUNTER — Ambulatory Visit: Payer: Commercial Managed Care - PPO | Admitting: Physical Therapy

## 2017-05-10 ENCOUNTER — Encounter: Payer: Self-pay | Admitting: Physical Therapy

## 2017-05-10 DIAGNOSIS — M545 Low back pain, unspecified: Secondary | ICD-10-CM

## 2017-05-10 NOTE — Therapy (Signed)
[facility] [address] [place], [place], [address] Phone: [phone]   Fax:  [phone]  Physical Therapy Treatment  Patient Details  Name: Katrina Osborne MRN: [id number] Date of Birth: January 09, 1981 Referring Provider: Gae Dry MD  Encounter Date: 05/10/2017      PT End of Session - 05/10/17 1525    Visit Number 5   Number of Visits 12   Date for PT Re-Evaluation 05/31/17   PT Start Time 0145   PT Stop Time 0237   PT Time Calculation (min) 52 min   Activity Tolerance Patient tolerated treatment well   Behavior During Therapy [facility] for tasks assessed/performed      Past Medical History:  Diagnosis Date  . History of herniated intervertebral disc   . Kidney stone   . Ovarian torsion     Past Surgical History:  Procedure Laterality Date  . CHOLECYSTECTOMY    . TONSILLECTOMY      There were no vitals filed for this visit.      Subjective Assessment - 05/10/17 1524    Subjective I'm feeling better today.   Pain Score 2    Pain Location Back   Pain Orientation Right   Pain Descriptors / Indicators Aching   Pain Type Surgical pain   Pain Onset More than a month ago                         [facility] Adult PT Treatment/Exercise - 05/10/17 0001      Exercises   Exercises Knee/Hip     Lumbar Exercises: Aerobic   Stationary Bike Nustep level 3 with a cadence of 70 steps/min. x 15 minutes.     Lumbar Exercises: Machines for Strengthening   Cybex Knee Extension 10# x 4 minutes.   Cybex Knee Flexion 30# x 4 minutes.     Modalities   Modalities Doctor, general practice Location Lumbar.   Electrical Stimulation Action IFC   Electrical Stimulation Parameters 80-150 Hz x 20 minutes at 80-150 Hz.   Electrical Stimulation Goals Tone;Pain                     PT Long Term Goals - 05/10/17 1528      PT LONG TERM GOAL #1   Title  Ind. with a HEP.   Time 6   Period Weeks   Status New     PT LONG TERM GOAL #2   Title Perform ADL's with pain not > 2/10.   Time 6   Period Weeks   Status New     PT LONG TERM GOAL #3   Title Walk a community distance with pain not > 2-3/10.   Time 6   Period Weeks   Status New               Plan - 05/10/17 1529    Clinical Impression Statement Patient did an excellent job with aditional exercise today.  No compliants.   PT Treatment/Interventions ADLs/Self Care Home Management;Cryotherapy;Electrical Stimulation;Ultrasound;Therapeutic activities;Therapeutic exercise;Neuromuscular re-education;Patient/family education;Manual techniques   PT Next Visit Plan Add mini-squats against plexiglass.      Patient will benefit from skilled therapeutic intervention in order to improve the following deficits and impairments:     Visit Diagnosis: Acute bilateral low back pain without sciatica     Problem List There are no active problems to display for this patient.  Aren Cherne, [place] MPT 05/10/2017, 3:31 PM  [facility] [address] [place], [place], [address] Phone: [phone]   Fax:  [phone]  Name: Anariah Phillip MRN: [id number] Date of Birth: [birth year]

## 2017-05-11 ENCOUNTER — Encounter: Payer: BLUE CROSS/BLUE SHIELD | Admitting: *Deleted

## 2017-05-13 ENCOUNTER — Encounter: Payer: BLUE CROSS/BLUE SHIELD | Admitting: *Deleted

## 2017-05-14 ENCOUNTER — Ambulatory Visit: Payer: Commercial Managed Care - PPO | Admitting: Physical Therapy

## 2017-05-14 ENCOUNTER — Encounter: Payer: BLUE CROSS/BLUE SHIELD | Admitting: *Deleted

## 2017-05-14 DIAGNOSIS — M545 Low back pain, unspecified: Secondary | ICD-10-CM

## 2017-05-14 NOTE — Therapy (Signed)
[facility] [address] [place], [place], [address] Phone: [phone]   Fax:  [phone]  Physical Therapy Treatment  Patient Details  Name: Katrina Osborne MRN: [id number] Date of Birth: [birth year] Referring Provider: Gae Dry MD  Encounter Date: 05/14/2017      PT End of Session - 05/14/17 1252    Visit Number 6   Number of Visits 12   Date for PT Re-Evaluation 05/31/17   PT Start Time 1155   PT Stop Time 1237   PT Time Calculation (min) 42 min   Activity Tolerance Patient tolerated treatment well   Behavior During Therapy [facility] for tasks assessed/performed      Past Medical History:  Diagnosis Date  . History of herniated intervertebral disc   . Kidney stone   . Ovarian torsion     Past Surgical History:  Procedure Laterality Date  . CHOLECYSTECTOMY    . TONSILLECTOMY      There were no vitals filed for this visit.      Subjective Assessment - 05/14/17 1248    Subjective My back is feeling better and I'm able to do more around the house.  I need to leave a bit earlier today for my dosctor's appointment.   Patient Stated Goals Get out of pain and get strong again.   Pain Score 2    Pain Location Back   Pain Orientation Right   Pain Descriptors / Indicators Aching   Pain Type Surgical pain   Pain Onset More than a month ago                         [facility] Adult PT Treatment/Exercise - 05/14/17 0001      Exercises   Exercises Knee/Hip     Lumbar Exercises: Aerobic   Stationary Bike Nustep level 6 x 15 minutes.     Lumbar Exercises: Machines for Strengthening   Cybex Knee Extension 10# x 5 minutes.   Cybex Knee Flexion 30# x 5 minutes.     Lumbar Exercises: Standing   Other Standing Lumbar Exercises Wall slides with draw-in for core activation against plexiglass to 45 degrees of knee flexion 3 sets one minute each.     Modalities   Modalities Doctor, general practice Location --  Lumbar.   Electrical Stimulation Action IFC   Electrical Stimulation Parameters 80-150 Hz x 10 minutes.   Electrical Stimulation Goals Tone;Pain                     PT Long Term Goals - 05/10/17 1528      PT LONG TERM GOAL #1   Title Ind. with a HEP.   Time 6   Period Weeks   Status New     PT LONG TERM GOAL #2   Title Perform ADL's with pain not > 2/10.   Time 6   Period Weeks   Status New     PT LONG TERM GOAL #3   Title Walk a community distance with pain not > 2-3/10.   Time 6   Period Weeks   Status New               Plan - 05/14/17 1253    Clinical Impression Statement Patient is progressing very well with ther ex with a consistnent decrease in pain.  She is also able to do more around her house.  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   Rehab Potential Excellent   PT Frequency 2x / week   PT Duration 6 weeks   PT Treatment/Interventions ADLs/Self Care Home Management;Cryotherapy;Electrical Stimulation;Ultrasound;Therapeutic activities;Therapeutic exercise;Neuromuscular re-education;Patient/family education;Manual techniques   PT Next Visit Plan Add mini-squats against plexiglass.   Consulted and Agree with Plan of Care Patient      Patient will benefit from skilled therapeutic intervention in order to improve the following deficits and impairments:  Pain, Decreased activity tolerance  Visit Diagnosis: Acute bilateral low back pain without sciatica     Problem List There are no active problems to display for this patient.   Rosabella Edgin, [place] MPT 05/14/2017, 12:56 PM  [facility] [address] [place], [place], [address] Phone: [phone]   Fax:  [phone]  Name: Katrina Osborne MRN: [id number] Date of Birth: 1981/06/19

## 2017-05-18 ENCOUNTER — Ambulatory Visit: Payer: Commercial Managed Care - PPO | Admitting: *Deleted

## 2017-05-18 DIAGNOSIS — M545 Low back pain, unspecified: Secondary | ICD-10-CM

## 2017-05-18 NOTE — Therapy (Signed)
[facility] [address] [place], [place], [address] Phone: [phone]   Fax:  [phone]  Physical Therapy Treatment  Patient Details  Name: Katrina Osborne MRN: [id number] Date of Birth: 05/17/1981 Referring Provider: Gae Dry MD  Encounter Date: 05/18/2017      PT End of Session - 05/18/17 1441    Visit Number 7   Number of Visits 12   Date for PT Re-Evaluation 05/31/17   PT Start Time 1430   PT Stop Time 1521   PT Time Calculation (min) 51 min      Past Medical History:  Diagnosis Date  . History of herniated intervertebral disc   . Kidney stone   . Ovarian torsion     Past Surgical History:  Procedure Laterality Date  . CHOLECYSTECTOMY    . TONSILLECTOMY      There were no vitals filed for this visit.      Subjective Assessment - 05/18/17 1435    Subjective My back is feeling better and I'm able to do more around the house.  BTW in 3 weeks   Patient Stated Goals Get out of pain and get strong again.   Currently in Pain? Yes   Pain Score 1    Pain Location Back   Pain Orientation Right   Pain Descriptors / Indicators Aching   Pain Type Surgical pain   Pain Onset More than a month ago                         [facility] Adult PT Treatment/Exercise - 05/18/17 0001      Therapeutic Activites    Therapeutic Activities ADL's;Lifting   Lifting power position and kneeling, hinge bending     Exercises   Exercises Knee/Hip     Lumbar Exercises: Aerobic   Elliptical ramp 5 level 5 x  mins   UBE (Upper Arm Bike) standing x at 90, 60  RPMs,  and nustep L6 x 8 mins     Lumbar Exercises: Standing   Row --  XTS pink 2x 10  with 3 sec holds     Lumbar Exercises: Supine   Bent Knee Raise 10 reps;3 seconds   Bridge 20 reps   Other Supine Lumbar Exercises Dying bug x 20     Lumbar Exercises: Prone   Opposite Arm/Leg Raise Right arm/Left leg;Left arm/Right leg;10 reps     Modalities   Modalities Electrical Stimulation     Electrical Stimulation   Electrical Stimulation Location Lumbar.   IFC x 15 mins 80-150 hz   Electrical Stimulation Goals Tone;Pain                     PT Long Term Goals - 05/10/17 1528      PT LONG TERM GOAL #1   Title Ind. with a HEP.   Time 6   Period Weeks   Status New     PT LONG TERM GOAL #2   Title Perform ADL's with pain not > 2/10.   Time 6   Period Weeks   Status New     PT LONG TERM GOAL #3   Title Walk a community distance with pain not > 2-3/10.   Time 6   Period Weeks   Status New               Plan - 05/18/17 1633    Clinical Impression Statement Pt arrived to clinic  doing fairly well with minimal pain. She reports that she will RTW in 3 weeks and wants to be ready. Rx focused on core strengthening and functional movements. Improved body mechanics with power position and did well with progression of act.'s   Clinical Presentation Stable   Clinical Decision Making Low   Rehab Potential Excellent   PT Frequency 2x / week   PT Duration 6 weeks   PT Next Visit Plan Add mini-squats against plexiglass.   Consulted and Agree with Plan of Care Patient      Patient will benefit from skilled therapeutic intervention in order to improve the following deficits and impairments:  Pain, Decreased activity tolerance  Visit Diagnosis: Acute bilateral low back pain without sciatica     Problem List There are no active problems to display for this patient.   RAMSEUR,CHRIS, PTA 05/18/2017, 4:38 PM  [facility] [address] [place], [place], [address] Phone: [phone]   Fax:  [phone]  Name: Katrina Osborne MRN: [id number] Date of Birth: [birth year]

## 2017-05-20 ENCOUNTER — Ambulatory Visit: Payer: Commercial Managed Care - PPO | Admitting: *Deleted

## 2017-05-20 DIAGNOSIS — M545 Low back pain, unspecified: Secondary | ICD-10-CM

## 2017-05-20 NOTE — Therapy (Signed)
[facility] [address] [place], [place], [address] Phone: [phone]   Fax:  [phone]  Physical Therapy Treatment  Patient Details  Name: Katrina Osborne MRN: [id number] Date of Birth: 12-27-1980 Referring Provider: Gae Dry MD  Encounter Date: 05/20/2017      PT End of Session - 05/20/17 1512    Visit Number 8   Number of Visits 12   Date for PT Re-Evaluation 05/31/17   PT Start Time 1430   PT Stop Time 1530   PT Time Calculation (min) 60 min      Past Medical History:  Diagnosis Date  . History of herniated intervertebral disc   . Kidney stone   . Ovarian torsion     Past Surgical History:  Procedure Laterality Date  . CHOLECYSTECTOMY    . TONSILLECTOMY      There were no vitals filed for this visit.      Subjective Assessment - 05/20/17 1509    Subjective My back is feeling better and I'm able to do more around the house.  BTW in 3 weeks, RT quad was sore after last Rx   Pertinent History H/o low back pain.   Patient Stated Goals Get out of pain and get strong again.   Currently in Pain? Yes   Pain Score 4    Pain Location Leg   Pain Descriptors / Indicators Aching   Pain Type Surgical pain   Pain Onset More than a month ago                         [facility] Adult PT Treatment/Exercise - 05/20/17 0001      Exercises   Exercises Knee/Hip     Lumbar Exercises: Aerobic   Stationary Bike Nustep level 6 x 12 minutes.   Elliptical ramp 5 level 5 x  10 mins     Lumbar Exercises: Standing   Row --  XTS pink 2x 20  with 3 sec holds     Knee/Hip Exercises: Standing   Lateral Step Up Both;2 sets;20 reps   Forward Step Up Both;2 sets;20 reps     Modalities   Modalities Electrical Stimulation     Electrical Stimulation   Electrical Stimulation Location Lumbar.   IFC x 15 mins 80-150 hz   Electrical Stimulation Goals Tone;Pain     Manual Therapy   Manual Therapy Passive ROM    Passive ROM prone quad strengthening                     PT Long Term Goals - 05/10/17 1528      PT LONG TERM GOAL #1   Title Ind. with a HEP.   Time 6   Period Weeks   Status New     PT LONG TERM GOAL #2   Title Perform ADL's with pain not > 2/10.   Time 6   Period Weeks   Status New     PT LONG TERM GOAL #3   Title Walk a community distance with pain not > 2-3/10.   Time 6   Period Weeks   Status New               Plan - 05/20/17 1731    Clinical Impression Statement Pt arrived to clinic today doing fairly well with minimal LBP and mainly had RT quad soreness from previous Rx. Rx focused on functional strengthening and quad strengthening. Decreased RT  quad pain after prone quad strengthening   Clinical Presentation Stable   Clinical Decision Making Low   Rehab Potential Excellent   PT Frequency 2x / week   PT Duration 6 weeks   PT Treatment/Interventions ADLs/Self Care Home Management;Cryotherapy;Electrical Stimulation;Ultrasound;Therapeutic activities;Therapeutic exercise;Neuromuscular re-education;Patient/family education;Manual techniques   PT Next Visit Plan Add mini-squats against plexiglass.   Consulted and Agree with Plan of Care Patient      Patient will benefit from skilled therapeutic intervention in order to improve the following deficits and impairments:  Pain, Decreased activity tolerance  Visit Diagnosis: Acute bilateral low back pain without sciatica     Problem List There are no active problems to display for this patient.   Amos Micheals,CHRIS, PTA 05/20/2017, 5:48 PM  [facility] [address] [place], [place], [address] Phone: [phone]   Fax:  [phone]  Name: Katrina Osborne MRN: [id number] Date of Birth: 03/19/1981

## 2017-05-21 ENCOUNTER — Ambulatory Visit: Payer: Commercial Managed Care - PPO | Admitting: *Deleted

## 2017-05-21 DIAGNOSIS — M545 Low back pain, unspecified: Secondary | ICD-10-CM

## 2017-05-21 NOTE — Therapy (Signed)
[facility] [address] [place], [place], [address] Phone: [phone]   Fax:  [phone]  Physical Therapy Treatment  Patient Details  Name: Katrina Osborne MRN: [id number] Date of Birth: 10/08/1980 Referring Provider: Gae DryGregory Howes MD  Encounter Date: 05/21/2017      PT End of Session - 05/21/17 1233    Visit Number 9   Number of Visits 12   Date for PT Re-Evaluation 05/31/17   PT Start Time 1030   PT Stop Time 1130   PT Time Calculation (min) 60 min      Past Medical History:  Diagnosis Date  . History of herniated intervertebral disc   . Kidney stone   . Ovarian torsion     Past Surgical History:  Procedure Laterality Date  . CHOLECYSTECTOMY    . TONSILLECTOMY      There were no vitals filed for this visit.      Subjective Assessment - 05/21/17 1055    Subjective My back is feeling better and I'm able to do more around the house.  BTW in 3 weeks, RT quad was sore after last Rx  stretching helped   Pertinent History H/o low back pain.   Patient Stated Goals Get out of pain and get strong again.   Currently in Pain? Yes   Pain Score 4    Pain Orientation Right   Pain Descriptors / Indicators Aching   Pain Type Surgical pain   Pain Onset More than a month ago   Pain Frequency Constant                         [facility] Adult PT Treatment/Exercise - 05/21/17 0001      Exercises   Exercises Knee/Hip     Lumbar Exercises: Aerobic   Stationary Bike L2 x 10 mins   Elliptical ramp 5 level 5 x  8 mins   UBE (Upper Arm Bike) standing x 5mins at 90, 60  RPMs,  and nustep L6 x 8 mins     Lumbar Exercises: Standing   Row --  XTS pink 2x 20  with 3 sec holds     Knee/Hip Exercises: Standing   Lateral Step Up Both;2 sets;20 reps   Forward Step Up Both;2 sets;20 reps     Modalities   Modalities Electrical Stimulation     Electrical Stimulation   Electrical Stimulation Location Lumbar.    IFC x 15 mins 80-150 hz   Electrical Stimulation Goals Tone;Pain     Manual Therapy   Manual Therapy Passive ROM   Passive ROM prone quad strengthening                     PT Long Term Goals - 05/21/17 1237      PT LONG TERM GOAL #1   Title Ind. with a HEP.   Time 6   Period Weeks   Status On-going     PT LONG TERM GOAL #2   Title Perform ADL's with pain not > 2/10.   Time 6   Period Weeks   Status On-going     PT LONG TERM GOAL #3   Title Walk a community distance with pain not > 2-3/10.   Time 6   Period Weeks   Status Achieved               Plan - 05/21/17 1234    Clinical Impression Statement Pt arrived today  doing fairly well with minimal LBP and Quad soreness 4/10. She was able to complete all core and functional activities with mainly fatigue feeling and no increased pain. She feels that stretching has helped with RT quad soreness.Marland Kitchen She was also able to meet ambulation LTG today.   Clinical Decision Making Low   Rehab Potential Excellent   PT Frequency 2x / week   PT Duration 6 weeks   PT Treatment/Interventions ADLs/Self Care Home Management;Cryotherapy;Electrical Stimulation;Ultrasound;Therapeutic activities;Therapeutic exercise;Neuromuscular re-education;Patient/family education;Manual techniques   PT Next Visit Plan Add mini-squats against plexiglass.   Consulted and Agree with Plan of Care Patient      Patient will benefit from skilled therapeutic intervention in order to improve the following deficits and impairments:  Pain, Decreased activity tolerance  Visit Diagnosis: Acute bilateral low back pain without sciatica     Problem List There are no active problems to display for this patient.   Walker Sitar,CHRIS , PTA 05/21/2017, 12:39 PM  [facility] [address] [place], [place], [address] Phone: [phone]   Fax:  [phone]  Name: Katrina Osborne MRN: [id number] Date of  Birth: 1981/07/09

## 2017-05-25 ENCOUNTER — Ambulatory Visit: Payer: Commercial Managed Care - PPO | Attending: Neurological Surgery | Admitting: *Deleted

## 2017-05-25 DIAGNOSIS — M545 Low back pain, unspecified: Secondary | ICD-10-CM

## 2017-05-25 NOTE — Therapy (Signed)
[facility] [address] [place], [place], [address] Phone: [phone]   Fax:  [phone]  Physical Therapy Treatment  Patient Details  Name: Katrina Osborne MRN: [id number] Date of Birth: August 01, 1981 Referring Provider: Gae Dry MD  Encounter Date: 05/25/2017      PT End of Session - 05/25/17 1015    Visit Number 10   Number of Visits 12   Date for PT Re-Evaluation 05/31/17   PT Start Time 0945   PT Stop Time 1035   PT Time Calculation (min) 50 min      Past Medical History:  Diagnosis Date  . History of herniated intervertebral disc   . Kidney stone   . Ovarian torsion     Past Surgical History:  Procedure Laterality Date  . CHOLECYSTECTOMY    . TONSILLECTOMY      There were no vitals filed for this visit.      Subjective Assessment - 05/25/17 1008    Subjective RT leg is bothering me today, but back feels ok   Pertinent History H/o low back pain.   Patient Stated Goals Get out of pain and get strong again.   Currently in Pain? Yes   Pain Score 4    Pain Location Leg   Pain Orientation Right   Pain Descriptors / Indicators Aching   Pain Type Surgical pain   Pain Onset More than a month ago   Pain Frequency Intermittent                         [facility] Adult PT Treatment/Exercise - 05/25/17 0001      Exercises   Exercises Knee/Hip     Lumbar Exercises: Aerobic   Stationary Bike L2 x 10 mins   Elliptical ramp 5 level 5 x  10 mins     Lumbar Exercises: Supine   Bent Knee Raise 10 reps;3 seconds   Bridge 20 reps   Other Supine Lumbar Exercises Dying bug x 20     Lumbar Exercises: Prone   Straight Leg Raise 10 reps;2 seconds   Opposite Arm/Leg Raise Right arm/Left leg;Left arm/Right leg;10 reps     Modalities   Modalities Electrical Stimulation     Electrical Stimulation   Electrical Stimulation Location Lumbar.   IFC x 15 mins 80-150 hz   Electrical Stimulation Goals  Tone;Pain     Manual Therapy   Manual Therapy Passive ROM   Passive ROM prone quad strengthening                     PT Long Term Goals - 05/21/17 1237      PT LONG TERM GOAL #1   Title Ind. with a HEP.   Time 6   Period Weeks   Status On-going     PT LONG TERM GOAL #2   Title Perform ADL's with pain not > 2/10.   Time 6   Period Weeks   Status On-going     PT LONG TERM GOAL #3   Title Walk a community distance with pain not > 2-3/10.   Time 6   Period Weeks   Status Achieved               Plan - 05/25/17 1155    Clinical Impression Statement pt arrived today doing fairly well with minimal LBP, but had some increased calf and quad soreness. Rx focused on fuctional conditioning and core strengthening as  well as stretching to her calf and quad. Pt responded well to Rx and had decreased pain/soreness after Rx.   Clinical Presentation Stable   Clinical Decision Making Low   Rehab Potential Excellent   PT Frequency 2x / week   PT Duration 6 weeks   PT Treatment/Interventions ADLs/Self Care Home Management;Cryotherapy;Electrical Stimulation;Ultrasound;Therapeutic activities;Therapeutic exercise;Neuromuscular re-education;Patient/family education;Manual techniques   PT Next Visit Plan Add mini-squats against plexiglass.   Consulted and Agree with Plan of Care Patient      Patient will benefit from skilled therapeutic intervention in order to improve the following deficits and impairments:  Pain, Decreased activity tolerance  Visit Diagnosis: Acute bilateral low back pain without sciatica     Problem List There are no active problems to display for this patient.   Tahj Njoku,CHRIS, PTA 05/25/2017, 12:02 PM  [facility] [address] [place], [place], [address] Phone: [phone]   Fax:  [phone]  Name: Katrina Osborne MRN: [id number] Date of Birth: 08/11/1981

## 2017-05-27 ENCOUNTER — Ambulatory Visit: Payer: Commercial Managed Care - PPO | Admitting: *Deleted

## 2017-05-27 DIAGNOSIS — M545 Low back pain, unspecified: Secondary | ICD-10-CM

## 2017-05-27 NOTE — Therapy (Signed)
[facility] [address] [place], [place], [address] Phone: [phone]   Fax:  [phone]  Physical Therapy Treatment  Patient Details  Name: Katrina LowersCynthia Michele Osborne MRN: [id number] Date of Birth: 03/03/1981 Referring Provider: Gae DryGregory Howes MD  Encounter Date: 05/27/2017      PT End of Session - 05/27/17 1445    Visit Number 11   Number of Visits 12   Date for PT Re-Evaluation 05/31/17   PT Start Time 1300   PT Stop Time 1350   PT Time Calculation (min) 50 min      Past Medical History:  Diagnosis Date  . History of herniated intervertebral disc   . Kidney stone   . Ovarian torsion     Past Surgical History:  Procedure Laterality Date  . CHOLECYSTECTOMY    . TONSILLECTOMY      There were no vitals filed for this visit.      Subjective Assessment - 05/27/17 1325    Subjective Did good after last Rx and today is good   Pertinent History H/o low back pain.   Patient Stated Goals Get out of pain and get strong again.   Currently in Pain? Yes   Pain Score 4    Pain Location Leg   Pain Orientation Right   Pain Descriptors / Indicators Aching   Pain Type Surgical pain   Pain Onset More than a month ago                         [facility] - 05/27/17 0001      Exercises   Exercises Knee/Hip     Lumbar Exercises: Aerobic   Stationary Bike  Nustep L2 x 10 mins   Elliptical ramp 5 level 5 x  10 mins     Lumbar Exercises: Standing   Row --  XTS pink x 20  with 3 sec holds     Lumbar Exercises: Seated   Sit to Stand 20 reps;10 reps  Focus on technique. Needs more hinge bending     Electrical Stimulation   Electrical Stimulation Location Lumbar.   IFC x 15 mins 80-150 hz   Electrical Stimulation Goals Tone;Pain     Manual Therapy   Manual Therapy Passive ROM   Passive ROM prone quad strengthening                     PT Long Term Goals - 05/21/17 1237       PT LONG TERM GOAL #1   Title Ind. with a HEP.   Time 6   Period Weeks   Status On-going     PT LONG TERM GOAL #2   Title Perform ADL's with pain not > 2/10.   Time 6   Period Weeks   Status On-going     PT LONG TERM GOAL #3   Title Walk a community distance with pain not > 2-3/10.   Time 6   Period Weeks   Status Achieved               Plan - 05/27/17 1332    Clinical Impression Statement Pt arrived to clinic today and had decreased RT leg soreness. She was able to perform therex today with mainly fatigue. She feels that she is progressing because things (ADLs) are getting easier.. Pt still needs practice with "power" position for lifting due to not bendinding more at her hip  Clinical Presentation Stable   Rehab Potential Excellent   PT Frequency 2x / week   PT Duration 6 weeks   PT Treatment/Interventions ADLs/Self Care Home Management;Cryotherapy;Electrical Stimulation;Ultrasound;Therapeutic activities;Therapeutic exercise;Neuromuscular re-education;Patient/family education;Manual techniques   PT Next Visit Plan Add mini-squats against plexiglass.   Consulted and Agree with Plan of Care Patient      Patient will benefit from skilled therapeutic intervention in order to improve the following deficits and impairments:  Pain, Decreased activity tolerance  Visit Diagnosis: Acute bilateral low back pain without sciatica     Problem List There are no active problems to display for this patient.   RAMSEUR,CHRIS, PTA 05/27/2017, 6:09 PM  [facility] [address] [place], [place], [address] Phone: [phone]   Fax:  [phone]  Name: Katrina Osborne MRN: [id number] Date of Birth: 1981-05-22

## 2017-06-01 ENCOUNTER — Ambulatory Visit: Payer: Commercial Managed Care - PPO | Admitting: *Deleted

## 2017-06-01 DIAGNOSIS — M545 Low back pain, unspecified: Secondary | ICD-10-CM

## 2017-06-01 NOTE — Therapy (Signed)
[facility] [address] [place], [place], [address] Phone: [phone]   Fax:  [phone]  Physical Therapy Treatment  Patient Details  Name: Katrina LowersCynthia Michele Eriksson MRN: [id number] Date of Birth: 01/30/1981 Referring Provider: Gae DryGregory Howes MD  Encounter Date: 06/01/2017      PT End of Session - 06/01/17 1541    Visit Number 12   Number of Visits 12   Date for PT Re-Evaluation 05/31/17   PT Start Time 1300   PT Stop Time 1351   PT Time Calculation (min) 51 min      Past Medical History:  Diagnosis Date  . History of herniated intervertebral disc   . Kidney stone   . Ovarian torsion     Past Surgical History:  Procedure Laterality Date  . CHOLECYSTECTOMY    . TONSILLECTOMY      There were no vitals filed for this visit.      Subjective Assessment - 06/01/17 1312    Subjective Did good after last Rx and today is good   Pertinent History H/o low back pain.   Patient Stated Goals Get out of pain and get strong again.   Currently in Pain? Yes   Pain Score 4    Pain Location Leg   Pain Orientation Right   Pain Descriptors / Indicators Aching   Pain Type Surgical pain   Pain Onset More than a month ago   Pain Frequency Intermittent                         [facility] Adult PT Treatment/Exercise - 06/01/17 0001      Therapeutic Activites    Therapeutic Activities Lifting   Lifting 14# box lift from floor to mat table     Exercises   Exercises Knee/Hip     Lumbar Exercises: Aerobic   Stationary Bike  Nustep L2 x 10 mins   UBE (Upper Arm Bike) standing x 5mins at 90,      Lumbar Exercises: Standing   Row Strengthening;Both;20 reps   Shoulder Extension AROM;Both;20 reps     Lumbar Exercises: Seated   Sit to Stand --     Lumbar Exercises: Quadruped   Straight Leg Raise 10 reps;3 seconds   Opposite Arm/Leg Raise 10 reps;3 seconds     Modalities   Modalities Electrical Stimulation;Cryotherapy      Cryotherapy   Number Minutes Cryotherapy 15 Minutes   Cryotherapy Location Lumbar Spine   Type of Cryotherapy Ice pack     Electrical Stimulation   Electrical Stimulation Location Lumbar.   IFC x 15 mins 80-150 hz   Electrical Stimulation Goals Tone;Pain     Manual Therapy   Manual Therapy --   Passive ROM --                     PT Long Term Goals - 05/21/17 1237      PT LONG TERM GOAL #1   Title Ind. with a HEP.   Time 6   Period Weeks   Status On-going     PT LONG TERM GOAL #2   Title Perform ADL's with pain not > 2/10.   Time 6   Period Weeks   Status On-going     PT LONG TERM GOAL #3   Title Walk a community distance with pain not > 2-3/10.   Time 6   Period Weeks   Status Achieved  Plan - 06/01/17 1543    Clinical Impression Statement Pt arrived today doing fairly well with minimal LBP and RT LE symptoms. She was able to perform functional activities and therex today with mainly fatigue end of Rx. Pt did well with 14# box lifts and normal response with modalities   Clinical Presentation Stable   Rehab Potential Excellent   PT Frequency 2x / week   PT Duration 6 weeks   PT Treatment/Interventions ADLs/Self Care Home Management;Cryotherapy;Electrical Stimulation;Ultrasound;Therapeutic activities;Therapeutic exercise;Neuromuscular re-education;Patient/family education;Manual techniques   PT Next Visit Plan Add mini-squats against plexiglass.   Consulted and Agree with Plan of Care Patient      Patient will benefit from skilled therapeutic intervention in order to improve the following deficits and impairments:  Pain, Decreased activity tolerance  Visit Diagnosis: Acute bilateral low back pain without sciatica     Problem List There are no active problems to display for this patient.   Journe Hallmark,CHRIS, PTA 06/01/2017, 3:55 PM  [facility] [address] [place], [place],  [address] Phone: [phone]   Fax:  [phone]  Name: Katrina Osborne MRN: [id number] Date of Birth: August 31, 1981

## 2017-06-03 ENCOUNTER — Ambulatory Visit: Payer: Commercial Managed Care - PPO | Admitting: *Deleted

## 2017-06-03 DIAGNOSIS — M545 Low back pain, unspecified: Secondary | ICD-10-CM

## 2017-06-03 NOTE — Therapy (Addendum)
[facility] [place], [place], [address] Phone: [phone]   Fax:  [phone]  Physical Therapy Treatment  Patient Details  Name: Katrina Osborne MRN: [id number] Date of Birth: 1980-12-27 Referring Provider: Dianna Rossetti MD  Encounter Date: 06/03/2017      PT End of Session - 06/03/17 1405    Visit Number 13   Date for PT Re-Evaluation 05/31/17   PT Start Time 1300   PT Stop Time 1351   PT Time Calculation (min) 51 min      Past Medical History:  Diagnosis Date  . History of herniated intervertebral disc   . Kidney stone   . Ovarian torsion     Past Surgical History:  Procedure Laterality Date  . CHOLECYSTECTOMY    . TONSILLECTOMY      There were no vitals filed for this visit.                       [facility] Adult PT Treatment/Exercise - 06/03/17 0001      Exercises   Exercises Knee/Hip     Lumbar Exercises: Aerobic   Stationary Bike  Nustep L2 x 10 mins   Elliptical ramp 5 level 5 x  10 mins     Lumbar Exercises: Standing   Row --  XTS pink x 20  with 3 sec holds   Shoulder Extension AROM;Both;20 reps  XTS pink 2x10     Knee/Hip Exercises: Standing   Rocker Board 5 minutes  calf stretching     Modalities   Modalities Electrical Stimulation;Cryotherapy     Cryotherapy   Number Minutes Cryotherapy 15 Minutes   Cryotherapy Location Lumbar Spine   Type of Cryotherapy Ice pack     Electrical Stimulation   Electrical Stimulation Location Lumbar.   IFC x 15 mins 80-150 hz   Electrical Stimulation Goals Tone;Pain     Manual Therapy   Manual Therapy Passive ROM   Passive ROM prone quad strengthening                     PT Long Term Goals - 05/21/17 1237      PT LONG TERM GOAL #1   Title Ind. with a HEP.   Time 6   Period Weeks   Status On-going     PT LONG TERM GOAL #2   Title Perform ADL's with pain not > 2/10.   Time 6   Period Weeks   Status  On-going     PT LONG TERM GOAL #3   Title Walk a community distance with pain not > 2-3/10.   Time 6   Period Weeks   Status Achieved               Plan - 06/03/17 1408    Clinical Impression Statement Pt arrived today doing fairly well with LBP, but very sore all over. She was able to complete all therex today with no increase in LBP and decreased LE soreness after passive stretching . Normal response to modalities. DC after next visit   Rehab Potential Excellent   PT Frequency 2x / week   PT Duration 6 weeks   PT Treatment/Interventions ADLs/Self Care Home Management;Cryotherapy;Electrical Stimulation;Ultrasound;Therapeutic activities;Therapeutic exercise;Neuromuscular re-education;Patient/family education;Manual techniques   PT Next Visit Plan Add mini-squats against plexiglass.   Consulted and Agree with Plan of Care Patient      Patient will benefit from skilled therapeutic intervention in order to improve  the following deficits and impairments:  Pain, Decreased activity tolerance  Visit Diagnosis: Acute bilateral low back pain without sciatica     Problem List There are no active problems to display for this patient.   RAMSEUR,CHRIS, PTA 06/03/2017, 2:11 PM  [facility] [address] [place], [place], [address] Phone: [phone]   Fax:  [phone]  Name: Katrina Osborne MRN: [id number] Date of Birth: 27-Mar-1981  PHYSICAL THERAPY DISCHARGE SUMMARY  Visits from Start of Care: 13.  Current functional level related to goals / functional outcomes: See above.   Remaining deficits: Continued LBP but patient met LTG #3.   Education / Equipment: HEP. Plan: Patient agrees to discharge.  Patient goals were partially met. Patient is being discharged due to being pleased with the current functional level.  ?????         Mali Applegate MPT

## 2017-06-04 ENCOUNTER — Encounter: Payer: BLUE CROSS/BLUE SHIELD | Admitting: *Deleted

## 2017-09-15 ENCOUNTER — Encounter (HOSPITAL_COMMUNITY): Payer: Self-pay | Admitting: Emergency Medicine

## 2017-09-15 ENCOUNTER — Emergency Department (HOSPITAL_COMMUNITY)
Admission: EM | Admit: 2017-09-15 | Discharge: 2017-09-15 | Disposition: A | Discharge status: Home / Self Care | Payer: Commercial Managed Care - PPO | Attending: Emergency Medicine | Admitting: Emergency Medicine

## 2017-09-15 ENCOUNTER — Other Ambulatory Visit: Payer: Self-pay

## 2017-09-15 DIAGNOSIS — R519 Headache, unspecified: Secondary | ICD-10-CM

## 2017-09-15 DIAGNOSIS — R51 Headache: Secondary | ICD-10-CM | POA: Diagnosis present

## 2017-09-15 DIAGNOSIS — Z79899 Other long term (current) drug therapy: Secondary | ICD-10-CM | POA: Insufficient documentation

## 2017-09-15 DIAGNOSIS — Z87891 Personal history of nicotine dependence: Secondary | ICD-10-CM | POA: Insufficient documentation

## 2017-09-15 DIAGNOSIS — M26621 Arthralgia of right temporomandibular joint: Secondary | ICD-10-CM | POA: Diagnosis not present

## 2017-09-15 DIAGNOSIS — E079 Disorder of thyroid, unspecified: Secondary | ICD-10-CM | POA: Diagnosis not present

## 2017-09-15 HISTORY — DX: Autoimmune thyroiditis: E06.3

## 2017-09-15 HISTORY — DX: Disorder of thyroid, unspecified: E07.9

## 2017-09-15 MED ORDER — METOCLOPRAMIDE HCL 5 MG/ML IJ SOLN
10.0000 mg | Freq: Once | INTRAMUSCULAR | Status: AC
  Administered 2017-09-15: 10 mg via INTRAVENOUS
  Filled 2017-09-15: qty 2

## 2017-09-15 MED ORDER — CLINDAMYCIN HCL 300 MG PO CAPS: 300.0000 mg | ORAL_CAPSULE | Freq: Four times a day (QID) | ORAL | 0 refills | Status: DC

## 2017-09-15 MED ORDER — CYCLOBENZAPRINE HCL 10 MG PO TABS: 10.0000 mg | ORAL_TABLET | Freq: Three times a day (TID) | ORAL | 0 refills | Status: DC | PRN

## 2017-09-15 MED ORDER — KETOROLAC TROMETHAMINE 30 MG/ML IJ SOLN
15.0000 mg | Freq: Once | INTRAMUSCULAR | Status: AC
  Administered 2017-09-15: 15 mg via INTRAVENOUS
  Filled 2017-09-15: qty 1

## 2017-09-15 MED ORDER — DIPHENHYDRAMINE HCL 50 MG/ML IJ SOLN
25.0000 mg | Freq: Once | INTRAMUSCULAR | Status: AC
  Administered 2017-09-15: 25 mg via INTRAVENOUS
  Filled 2017-09-15: qty 1

## 2017-09-15 NOTE — ED Triage Notes (Signed)
Pt complaining of swelling to right side of neck/face for 4-5 days. Nausea and diarrhea starting today. Pt denies dental caries.

## 2017-09-15 NOTE — ED Provider Notes (Signed)
[facility] EMERGENCY DEPARTMENT Provider Note   CSN: [id number] Arrival date & time: 09/15/17  0825     History   Chief Complaint Chief Complaint  Patient presents with  . Headache    swelling to right side of face    HPI Royston SinnerCynthia Gootee is a 36 y.o. female.  HPI  Royston SinnerCynthia Waters is a 36 y.o. female who presents to the Emergency Department complaining of gradual onset of right facial pain and swelling for 4-5 days.  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  States that her right face and neck feel "tight and swollen." Today, she states that she developed a right sided headache that radiates from her right face to her temple and parietal scalp into her right neck.  Describes the pain as throbbing sensation.  No photophobia or phonophobia.  Nausea without vomiting.  Denies neck stiffness. Took 800 mg Ibuprofen around 7:00 am this morning without relief.  Denies recent illness, visual changes, fever, or dental pain   Past Medical History:  Diagnosis Date  . Hashimoto's disease   . Hashimoto's disease 09/22/2015  . History of herniated intervertebral disc   . Kidney stone   . Ovarian torsion   . Thyroid disease     There are no active problems to display for this patient.   Past Surgical History:  Procedure Laterality Date  . BACK SURGERY    . CHOLECYSTECTOMY    . TONSILLECTOMY      OB History    Gravida Para Term Preterm AB Living   1             SAB TAB Ectopic Multiple Live Births                   Home Medications    Prior to Admission medications   Medication Sig Start Date End Date Taking? Authorizing Provider  HYDROcodone-acetaminophen (NORCO/VICODIN) 5-325 MG tablet Take 1 tablet by mouth every 6 (six) hours as needed for moderate pain.   Yes [provider]  ibuprofen (ADVIL,MOTRIN) 200 MG tablet Take 800 mg by mouth every 6 (six) hours as needed.   Yes [provider]  levothyroxine (SYNTHROID, LEVOTHROID) 25  MCG tablet Take 25 mcg by mouth daily before breakfast.   Yes [provider]  methocarbamol (ROBAXIN) 500 MG tablet Take 500 mg by mouth daily as needed for muscle spasms.   Yes [provider]  pantoprazole (PROTONIX) 40 MG tablet Take 40 mg by mouth daily.   Yes [provider]  sertraline (ZOLOFT) 50 MG tablet Take 50 mg by mouth at bedtime.   Yes [provider]    Family History History reviewed. No pertinent family history.  Social History Social History   Tobacco Use  . Smoking status: Former Smoker    Last attempt to quit: 09/15/2012    Years since quitting: 5.0  . Smokeless tobacco: Never Used  Substance Use Topics  . Alcohol use: No  . Drug use: Not on file     Allergies   Morphine and related and Penicillins   Review of Systems Review of Systems  Constitutional: Negative for activity change, appetite change and fever.  HENT: Negative for facial swelling and trouble swallowing.   Eyes: Positive for photophobia. Negative for pain and visual disturbance.  Respiratory: Negative for shortness of breath.   Cardiovascular: Negative for chest pain.  Gastrointestinal: Negative for nausea and vomiting.  Musculoskeletal: Negative for  neck pain and neck stiffness.  Skin: Negative for rash and wound.  Neurological: Positive for headaches. Negative for dizziness, facial asymmetry, speech difficulty, weakness and numbness.  Psychiatric/Behavioral: Negative for confusion and decreased concentration.  All other systems reviewed and are negative.    Physical Exam Updated Vital Signs BP 119/80 (BP Location: Left Arm)   Pulse (!) 53   Temp 98.4 F (36.9 C) (Oral)   Resp 18   Ht 5\' 6"  (1.676 m)   Wt 111.1 kg (245 lb)   LMP 09/15/2017   SpO2 100%   Breastfeeding? Unknown   BMI 39.54 kg/m   Physical Exam  Constitutional: She is oriented to person, place, and time. She appears well-developed and well-nourished. No distress.  HENT:    Head: Normocephalic and atraumatic.  Right Ear: Tympanic membrane and ear canal normal.  Left Ear: Tympanic membrane and ear canal normal.  Mouth/Throat: Uvula is midline, oropharynx is clear and moist and mucous membranes are normal. No oral lesions. No trismus in the jaw. Normal dentition. No uvula swelling.  ttp at the right TMJ, no click with opening and closing the mouth. Mild ttp of the upper right molars.  No erythema or edema of the surrounding gingiva.  No obvious facial edema noted on exam.  Eyes: Conjunctivae and EOM are normal. Pupils are equal, round, and reactive to light.  Neck: Normal range of motion and phonation normal. Neck supple. No spinous process tenderness and no muscular tenderness present. No neck rigidity. No Kernig's sign noted.  ttp of the right SCM and trapezius muscles.   Cardiovascular: Normal rate, regular rhythm and intact distal pulses.  Pulmonary/Chest: Effort normal and breath sounds normal. No respiratory distress.  Musculoskeletal: Normal range of motion.  Neurological: She is alert and oriented to person, place, and time. She has normal strength. No cranial nerve deficit or sensory deficit. She exhibits normal muscle tone. Coordination and gait normal. GCS eye subscore is 4. GCS verbal subscore is 5. GCS motor subscore is 6.  Reflex Scores:      Tricep reflexes are 2+ on the right side and 2+ on the left side.      Bicep reflexes are 2+ on the right side and 2+ on the left side. CN III-XII grossly intact, no facial droop, dysarthria or pronator drift.    Skin: Skin is warm and dry. Capillary refill takes less than 2 seconds. No rash noted.  Psychiatric: She has a normal mood and affect. Her behavior is normal. Thought content normal.  Nursing note and vitals reviewed.    ED Treatments / Results  Labs (all labs ordered are listed, but only abnormal results are displayed) Labs Reviewed - No data to display  EKG  EKG Interpretation None        Radiology No results found.  Procedures Procedures (including critical care time)  Medications Ordered in ED Medications  diphenhydrAMINE (BENADRYL) injection 25 mg (25 mg Intravenous Given 09/15/17 0948)  ketorolac (TORADOL) 30 MG/ML injection 15 mg (15 mg Intravenous Given 09/15/17 0948)  metoCLOPramide (REGLAN) injection 10 mg (10 mg Intravenous Given 09/15/17 0948)     Initial Impression / Assessment and Plan / ED Course  I have reviewed the triage vital signs and the nursing notes.  Pertinent labs & imaging results that were available during my care of the patient were reviewed by me and considered in my medical decision making (see chart for details).     Pt is well appearing.  No meningeal signs.  No focal neuro deficits.    On recheck, pt reports feeling better and headache is resolving.  Headache felt to be result of TMJ.  She appears stable for d/c.  Return precautions discussed.   At dispo, pt states that she contacted her dentist and was told that she may have some dental caries present at her last dental visit, so I will prescribe abx to cover possible facial swelling secondary to dental infection and pt agrees to arrange close dental f/u soon  Final Clinical Impressions(s) / ED Diagnoses   Final diagnoses:  Headache disorder  Arthralgia of right temporomandibular joint    ED Discharge Orders    None       Rosey Bath 09/15/17 1143    Mesner, Barbara Cower, MD 09/15/17 [phone]

## 2017-09-15 NOTE — Discharge Instructions (Signed)
Continue taking the ibuprofen 600 to 800 mg 3 times a day with food.  Follow up with your dentist regarding the TMJ.  Return here for any worsening symptoms

## 2018-10-17 ENCOUNTER — Ambulatory Visit
Admission: RE | Admit: 2018-10-17 | Discharge: 2018-10-17 | Disposition: A | Discharge status: Home / Self Care | Payer: Commercial Managed Care - PPO | Source: Ambulatory Visit | Attending: Physician Assistant | Admitting: Physician Assistant

## 2018-10-17 ENCOUNTER — Other Ambulatory Visit: Payer: Self-pay | Admitting: Physician Assistant

## 2018-10-17 DIAGNOSIS — R0609 Other forms of dyspnea: Principal | ICD-10-CM

## 2018-12-01 ENCOUNTER — Ambulatory Visit: Payer: Commercial Managed Care - PPO | Admitting: Physical Therapy

## 2018-12-07 ENCOUNTER — Encounter: Payer: Self-pay | Admitting: Physical Therapy

## 2018-12-07 ENCOUNTER — Other Ambulatory Visit: Payer: Self-pay

## 2018-12-07 ENCOUNTER — Ambulatory Visit: Payer: Commercial Managed Care - PPO | Attending: Neurological Surgery | Admitting: Physical Therapy

## 2018-12-07 DIAGNOSIS — M542 Cervicalgia: Secondary | ICD-10-CM | POA: Insufficient documentation

## 2018-12-07 NOTE — Therapy (Signed)
[facility] [address] [place], [place], [address] Phone: [phone]   Fax:  [phone]  Physical Therapy Evaluation  Patient Details  Name: Katrina Osborne MRN: [id number] Date of Birth: 10/28/1980 Referring Provider (PT): Marikay Alar MD.   Encounter Date: 12/07/2018  PT End of Session - 12/07/18 1420    Visit Number  1    Number of Visits  12    Date for PT Re-Evaluation  01/18/19    Authorization Type  FOTO AT LEAST EVERY 5TH VISIT. PROGRESS NOTE AT 10TH VISIT.    PT Start Time  0100    PT Stop Time  0150    PT Time Calculation (min)  50 min    Activity Tolerance  Patient tolerated treatment well    Behavior During Therapy  WFL for tasks assessed/performed       Past Medical History:  Diagnosis Date  . Hashimoto's disease   . Hashimoto's disease 09/22/2015  . History of herniated intervertebral disc   . Kidney stone   . Ovarian torsion   . Thyroid disease     Past Surgical History:  Procedure Laterality Date  . BACK SURGERY    . CHOLECYSTECTOMY    . TONSILLECTOMY      There were no vitals filed for this visit.   Subjective Assessment - 12/07/18 1342    Subjective  The patient underwent a C4-7 cervical fusion on 09/29/18.  She was doing well but then developed bilateral PE's.  She is doing better now and is on medication for this.  She is pleased with the outcome of her surgery, however, though she reports continued neck pain and occasional headaches.  Her pain rating today is a 6/10.      Pertinent History  Lumbar surgery.    How long can you sit comfortably?  15-20 minutes.    Patient Stated Goals  Get stamina up and decrease neck pain.    Currently in Pain?  Yes    Pain Score  6     Pain Location  Neck    Pain Orientation  Right;Left    Pain Descriptors / Indicators  Aching;Tightness    Pain Type  Surgical pain    Aggravating Factors   prolonged sitting and lying.    Pain Relieving Factors  Rest, heat.          [facility] PT Assessment - 12/07/18 0001      Assessment   Medical Diagnosis  Spinal stenosis, cervical region.    Referring Provider (PT)  Marikay Alar MD.    Onset Date/Surgical Date  --   09/29/18 (surgery date).     Precautions   Precautions  --   CERVICAL FUSION PROTOCOL.     Restrictions   Weight Bearing Restrictions  No      Balance Screen   Has the patient fallen in the past 6 months  Yes    Has the patient had a decrease in activity level because of a fear of falling?   No    Is the patient reluctant to leave their home because of a fear of falling?   No      Home Environment   Living Environment  Private residence      Prior Function   Level of Independence  Independent      Observation/Other Assessments   Focus on Therapeutic Outcomes (FOTO)   59% limitation.      Posture/Postural Control   Posture/Postural Control  No significant  limitations      Deep Tendon Reflexes   DTR Assessment Site  Biceps;Brachioradialis;Triceps    Biceps DTR  --   RT absent, LT= 1+/4+.   Brachioradialis DTR  --   RT= 1+/4+; LT= 1+ to 2+/4+.   Triceps DTR  --   RT=2+/4+; LT=1+ to 2+/4+.     ROM / Strength   AROM / PROM / Strength  AROM;Strength      AROM   Overall AROM Comments  Bilateral active cervical rotation= 58 degrees.        Strength   Overall Strength Comments  Normal bilateral elbow strength. Right grip= 80# and left= 65#.      Palpation   Palpation comment  Tender to palption over bilateral mid-cervical paraspinal musculature and UT's right > left.      Ambulation/Gait   Gait Comments  WNL.                Objective measurements completed on examination: See above findings.      [facility] Adult PT Treatment/Exercise - 12/07/18 0001      Modalities   Modalities  Electrical Stimulation;Moist Heat      Moist Heat Therapy   Number Minutes Moist Heat  20 Minutes    Moist Heat Location  Cervical      Electrical Stimulation   Electrical Stimulation  Location  Bilateral cervical/UT's.    Electrical Stimulation Action  IFC    Electrical Stimulation Parameters  80-150 Hz on 100% scan x 20 minutes.    Electrical Stimulation Goals  Tone;Pain                  PT Long Term Goals - 12/07/18 1444      PT LONG TERM GOAL #1   Title  Ind. with a HEP.    Time  6    Period  Weeks    Status  New      PT LONG TERM GOAL #2   Title  Perform ADL's with pain not > 2/10.    Time  6    Period  Weeks    Status  New      PT LONG TERM GOAL #3   Title  Bilateral active cervical rotation to 65 degrees.    Time  6    Period  Weeks    Status  New             Plan - 12/07/18 1421    Clinical Impression Statement  The patient presents to [facility] s/p cervical fusion (C4-7) performed on 09/29/18.  She is pleased with the outcome of her surgery.  She continues to reports neck pain and stiffness and occasional headaches.  As expected she has a loss of cervical range of motion.  Her bilateral elbow and grip strength is essentially normal.  She is tender to palpation over bilateral cervical paraspinal musculature and UT's.  Patient will benefit from skilled physical therapy intervention to address deficits and pain.    Examination-Activity Limitations  Sit    Examination-Participation Restrictions  Yard Work    Stability/Clinical Decision Making  Stable/Uncomplicated    Clinical Decision Making  Low    PT Frequency  2x / week    PT Duration  6 weeks    PT Treatment/Interventions  ADLs/Self Care Home Management;Cryotherapy;Electrical Stimulation;Ultrasound;Moist Heat;Therapeutic activities;Therapeutic exercise;Patient/family education;Manual techniques    PT Next Visit Plan  STW/M; pulsed U/S at 3.3 Mhz; Please follow cervical fusion protocol.  UBE (progress to  standing); UBE; ellitptical.    Consulted and Agree with Plan of Care  Patient       Patient will benefit from skilled therapeutic intervention in order to improve the following deficits  and impairments:  Decreased activity tolerance, Pain, Decreased range of motion, Increased muscle spasms  Visit Diagnosis: Cervicalgia - Plan: PT plan of care cert/re-cert     Problem List There are no active problems to display for this patient.   Alonda Weaber, [place] MPT 12/07/2018, 2:47 PM  [facility] [address] [place], [place], [address] Phone: [phone]   Fax:  [phone]  Name: Royston SinnerCynthia Trabucco MRN: [id number] Date of Birth: 08/12/1981

## 2018-12-09 ENCOUNTER — Ambulatory Visit: Payer: Commercial Managed Care - PPO | Admitting: Physical Therapy

## 2018-12-12 ENCOUNTER — Encounter: Payer: Commercial Managed Care - PPO | Admitting: Physical Therapy

## 2018-12-14 ENCOUNTER — Encounter: Payer: Commercial Managed Care - PPO | Admitting: Physical Therapy

## 2018-12-19 ENCOUNTER — Ambulatory Visit: Payer: Commercial Managed Care - PPO | Admitting: Physical Therapy

## 2018-12-22 ENCOUNTER — Other Ambulatory Visit: Payer: Self-pay

## 2018-12-22 ENCOUNTER — Ambulatory Visit: Payer: Commercial Managed Care - PPO | Attending: Neurological Surgery | Admitting: Physical Therapy

## 2018-12-22 DIAGNOSIS — M542 Cervicalgia: Secondary | ICD-10-CM | POA: Insufficient documentation

## 2018-12-22 DIAGNOSIS — M545 Low back pain, unspecified: Secondary | ICD-10-CM

## 2018-12-22 NOTE — Therapy (Signed)
[facility] [address] [place], [place], [address] Phone: [phone]   Fax:  [phone]  Physical Therapy Treatment  Patient Details  Name: Katrina Osborne MRN: [id number] Date of Birth: [birth year] Referring Provider (PT): Marikay Alar MD.   Encounter Date: 12/22/2018  PT End of Session - 12/22/18 1411    Visit Number  2    Number of Visits  12    Date for PT Re-Evaluation  01/18/19    Authorization Type  FOTO AT LEAST EVERY 5TH VISIT. PROGRESS NOTE AT 10TH VISIT.    PT Start Time  0102    PT Stop Time  0156    PT Time Calculation (min)  54 min    Activity Tolerance  Patient tolerated treatment well    Behavior During Therapy  [facility] for tasks assessed/performed       Past Medical History:  Diagnosis Date  . Hashimoto's disease   . Hashimoto's disease 09/22/2015  . History of herniated intervertebral disc   . Kidney stone   . Ovarian torsion   . Thyroid disease     Past Surgical History:  Procedure Laterality Date  . BACK SURGERY    . CHOLECYSTECTOMY    . TONSILLECTOMY      There were no vitals filed for this visit.  Subjective Assessment - 12/22/18 1311    Subjective  I had to cancel my treatment the other day as I had pleurisy.  I didn't have any PE's though.    How long can you sit comfortably?  15-20 minutes.    Patient Stated Goals  Get stamina up and decrease neck pain.    Currently in Pain?  Yes    Pain Score  4     Pain Location  Neck    Pain Orientation  Right;Left    Pain Descriptors / Indicators  Aching;Tightness                       [facility] Adult PT Treatment/Exercise - 12/22/18 0001      Modalities   Modalities  Electrical Stimulation;Moist Heat;Ultrasound      Moist Heat Therapy   Number Minutes Moist Heat  20 Minutes    Moist Heat Location  Cervical      Electrical Stimulation   Electrical Stimulation Location  Bilateral cervical.    Electrical Stimulation Action  IFC     Electrical Stimulation Parameters  80-150 Hz at 80-150 Hz x 20 minutes on 100% scan.    Electrical Stimulation Goals  Tone;Pain      Ultrasound   Ultrasound Location  Bilateral UT's.    Ultrasound Parameters  1.50 W/CM2 x 12 minutes.    Ultrasound Goals  Pain      Manual Therapy   Manual Therapy  Soft tissue mobilization    Soft tissue mobilization  STW/M x 16 minutes to bilateral mid-cervical paraspinal musculature and UT's to reduce tone.                  PT Long Term Goals - 12/07/18 1444      PT LONG TERM GOAL #1   Title  Ind. with a HEP.    Time  6    Period  Weeks    Status  New      PT LONG TERM GOAL #2   Title  Perform ADL's with pain not > 2/10.    Time  6    Period  Weeks  Status  New      PT LONG TERM GOAL #3   Title  Bilateral active cervical rotation to 65 degrees.    Time  6    Period  Weeks    Status  New            Plan - 12/22/18 1412    Clinical Impression Statement  Patient did very well with treat ment today.  She has continued tone over bilateral cervical paraspinal muscualture and UT's.  She felt the STW/M was very helpful today.      PT Treatment/Interventions  ADLs/Self Care Home Management;Cryotherapy;Electrical Stimulation;Ultrasound;Moist Heat;Therapeutic activities;Therapeutic exercise;Patient/family education;Manual techniques    PT Next Visit Plan  STW/M; pulsed U/S at 3.3 Mhz; Please follow cervical fusion protocol.  UBE (progress to standing); UBE; ellitptical.    Consulted and Agree with Plan of Care  Patient       Patient will benefit from skilled therapeutic intervention in order to improve the following deficits and impairments:  Decreased activity tolerance, Pain, Decreased range of motion, Increased muscle spasms  Visit Diagnosis: Cervicalgia  Acute bilateral low back pain without sciatica     Problem List There are no active problems to display for this patient.   , [place] MPT 12/22/2018, 2:14  PM  [facility] [address] [place], [place], [address] Phone: [phone]   Fax:  [phone]  Name: Katrina Osborne MRN: [id number] Date of Birth: February 28, 1981

## 2018-12-27 ENCOUNTER — Encounter: Payer: Self-pay | Admitting: Physical Therapy

## 2018-12-27 ENCOUNTER — Other Ambulatory Visit: Payer: Self-pay

## 2018-12-27 ENCOUNTER — Ambulatory Visit: Payer: Commercial Managed Care - PPO | Admitting: Physical Therapy

## 2018-12-27 DIAGNOSIS — M542 Cervicalgia: Secondary | ICD-10-CM

## 2018-12-27 DIAGNOSIS — M545 Low back pain, unspecified: Secondary | ICD-10-CM

## 2018-12-27 NOTE — Therapy (Signed)
[facility] [address] [place], [place], [address] Phone: [phone]   Fax:  [phone]  Physical Therapy Treatment  Patient Details  Name: Katrina Osborne MRN: [id number] Date of Birth: 05-07-1981 Referring Provider (PT): Marikay Alar MD.   Encounter Date: 12/27/2018  PT End of Session - 12/27/18 1438    Visit Number  3    Number of Visits  12    Date for PT Re-Evaluation  01/18/19    Authorization Type  FOTO AT LEAST EVERY 5TH VISIT. PROGRESS NOTE AT 10TH VISIT.    PT Start Time  0103    PT Stop Time  0208    PT Time Calculation (min)  65 min    Activity Tolerance  Patient tolerated treatment well    Behavior During Therapy  [facility] for tasks assessed/performed       Past Medical History:  Diagnosis Date  . Hashimoto's disease   . Hashimoto's disease 09/22/2015  . History of herniated intervertebral disc   . Kidney stone   . Ovarian torsion   . Thyroid disease     Past Surgical History:  Procedure Laterality Date  . BACK SURGERY    . CHOLECYSTECTOMY    . TONSILLECTOMY      There were no vitals filed for this visit.  Subjective Assessment - 12/27/18 1439    Subjective  That last treatment helped.    Pertinent History  Lumbar surgery.    How long can you sit comfortably?  15-20 minutes.    Patient Stated Goals  Get stamina up and decrease neck pain.    Currently in Pain?  Yes    Pain Score  4     Pain Location  Neck    Pain Orientation  Right;Left    Pain Descriptors / Indicators  Aching;Tightness                       [facility] Adult PT Treatment/Exercise - 12/27/18 0001      Modalities   Modalities  Electrical Stimulation;Moist Heat      Moist Heat Therapy   Number Minutes Moist Heat  20 Minutes    Moist Heat Location  Shoulder      Electrical Stimulation   Electrical Stimulation Location  Bilateral cervical.    Electrical Stimulation Action  IFC    Electrical Stimulation Parameters  80-150 Hz  at 80-150 Hz x 20 minutes.    Electrical Stimulation Goals  Tone;Pain      Ultrasound   Ultrasound Location  Bilateral UT's.    Ultrasound Parameters  1.50 W/CM2 x 12 minutes while seated.    Ultrasound Goals  Pain      Manual Therapy   Manual Therapy  Soft tissue mobilization    Soft tissue mobilization  STW/M including TP release technique to patient's bilateral UT's x 20 minutes in prone.                  PT Long Term Goals - 12/07/18 1444      PT LONG TERM GOAL #1   Title  Ind. with a HEP.    Time  6    Period  Weeks    Status  New      PT LONG TERM GOAL #2   Title  Perform ADL's with pain not > 2/10.    Time  6    Period  Weeks    Status  New  PT LONG TERM GOAL #3   Title  Bilateral active cervical rotation to 65 degrees.    Time  6    Period  Weeks    Status  New            Plan - 12/27/18 1439    Clinical Impression Statement  Patient did well today.  She has continued increased tone especially in bilateral UT's.  She also had pain today in the region of her left scapular border today.    PT Treatment/Interventions  ADLs/Self Care Home Management;Cryotherapy;Electrical Stimulation;Ultrasound;Moist Heat;Therapeutic activities;Therapeutic exercise;Patient/family education;Manual techniques    PT Next Visit Plan  STW/M; pulsed U/S at 3.3 Mhz; Please follow cervical fusion protocol.  UBE (progress to standing); UBE; ellitptical.    Consulted and Agree with Plan of Care  Patient       Patient will benefit from skilled therapeutic intervention in order to improve the following deficits and impairments:  Decreased activity tolerance, Pain, Decreased range of motion, Increased muscle spasms  Visit Diagnosis: Cervicalgia  Acute bilateral low back pain without sciatica     Problem List There are no active problems to display for this patient.   Aubert Choyce, [place] MPT 12/27/2018, 3:08 PM  [facility] [address] [place], [place], [address] Phone: [phone]   Fax:  [phone]  Name: Katrina SinnerCynthia Osborne MRN: [id number] Date of Birth: 02/20/1981

## 2018-12-28 ENCOUNTER — Ambulatory Visit: Payer: Commercial Managed Care - PPO | Admitting: Physical Therapy

## 2018-12-30 ENCOUNTER — Encounter: Payer: Self-pay | Admitting: Physical Therapy

## 2018-12-30 ENCOUNTER — Other Ambulatory Visit: Payer: Self-pay

## 2018-12-30 ENCOUNTER — Ambulatory Visit: Payer: Commercial Managed Care - PPO | Admitting: Physical Therapy

## 2018-12-30 DIAGNOSIS — M545 Low back pain, unspecified: Secondary | ICD-10-CM

## 2018-12-30 DIAGNOSIS — M542 Cervicalgia: Secondary | ICD-10-CM | POA: Diagnosis not present

## 2018-12-30 NOTE — Therapy (Signed)
[facility] [address] [place], [place], [address] Phone: [phone]   Fax:  [phone]  Physical Therapy Treatment  Patient Details  Name: Katrina SinnerCynthia Osborne MRN: [id number] Date of Birth: 05/20/1981 Referring Provider (PT): Marikay Alaravid Jones MD.   Encounter Date: 12/30/2018  PT End of Session - 12/30/18 1047    Visit Number  4    Number of Visits  12    Date for PT Re-Evaluation  01/18/19    Authorization Type  FOTO AT LEAST EVERY 5TH VISIT. PROGRESS NOTE AT 10TH VISIT.    PT Start Time  [phone]    PT Stop Time  1047    PT Time Calculation (min)  58 min       Past Medical History:  Diagnosis Date  . Hashimoto's disease   . Hashimoto's disease 09/22/2015  . History of herniated intervertebral disc   . Kidney stone   . Ovarian torsion   . Thyroid disease     Past Surgical History:  Procedure Laterality Date  . BACK SURGERY    . CHOLECYSTECTOMY    . TONSILLECTOMY      There were no vitals filed for this visit.  Subjective Assessment - 12/30/18 1114    Subjective  I played playstation for about 3 hours yesterday and was hurting a lot especially between my shoulder blades.      Pertinent History  Lumbar surgery.    How long can you sit comfortably?  15-20 minutes.    Patient Stated Goals  Get stamina up and decrease neck pain.    Currently in Pain?  Yes    Pain Score  5     Pain Location  Neck    Pain Orientation  Right;Left    Pain Descriptors / Indicators  Aching;Tightness    Pain Type  Surgical pain                       [facility] Adult PT Treatment/Exercise - 12/30/18 0001      Modalities   Modalities  Electrical Stimulation;Moist Heat;Ultrasound      Moist Heat Therapy   Number Minutes Moist Heat  20 Minutes    Moist Heat Location  Cervical      Electrical Stimulation   Electrical Stimulation Location  Bilateral cervical/upper scap.    Electrical Stimulation Action  IFC    Electrical Stimulation  Parameters  80-150 Hz x 20 minutes.    Electrical Stimulation Goals  Tone;Pain      Ultrasound   Ultrasound Location  Bilateral UT's    Ultrasound Parameters  Combo e'stim/U/S at 1.50 W/CM2 x 12 minutes.    Ultrasound Goals  Pain      Manual Therapy   Manual Therapy  Soft tissue mobilization    Soft tissue mobilization  STW/M x 13 minutes to bilateral UT's and scapular musculature to decrease tone and pain.             PT Education - 12/30/18 1117    Education Details  Instructed patient in scapular retraction with red theraband.  Red theraband provided for home use.    Person(s) Educated  Patient    Methods  Explanation;Demonstration    Comprehension  Verbalized understanding;Returned demonstration          PT Long Term Goals - 12/07/18 1444      PT LONG TERM GOAL #1   Title  Ind. with a HEP.    Time  6  Period  Weeks    Status  New      PT LONG TERM GOAL #2   Title  Perform ADL's with pain not > 2/10.    Time  6    Period  Weeks    Status  New      PT LONG TERM GOAL #3   Title  Bilateral active cervical rotation to 65 degrees.    Time  6    Period  Weeks    Status  New            Plan - 12/30/18 1130    Clinical Impression Statement  The patient did well with treatment today.  We discussed correct posture especially when playing viseo games.  She was also instructed in a HEP with red theraband and was provided with theraband for home use.      PT Treatment/Interventions  ADLs/Self Care Home Management;Cryotherapy;Electrical Stimulation;Ultrasound;Moist Heat;Therapeutic activities;Therapeutic exercise;Patient/family education;Manual techniques    PT Next Visit Plan  STW/M; pulsed U/S at 3.3 Mhz; Please follow cervical fusion protocol.  UBE (progress to standing); UBE; ellitptical.    Consulted and Agree with Plan of Care  Patient       Patient will benefit from skilled therapeutic intervention in order to improve the following deficits and  impairments:  Decreased activity tolerance, Pain, Decreased range of motion, Increased muscle spasms  Visit Diagnosis: Cervicalgia  Acute bilateral low back pain without sciatica     Problem List There are no active problems to display for this patient.   APPLEGATE, [place] MPT 12/30/2018, 11:33 AM  [facility] [address] [place], [place], [address] Phone: [phone]   Fax:  [phone]  Name: Katrina Osborne MRN: [id number] Date of Birth: 05-12-1981

## 2019-01-02 ENCOUNTER — Other Ambulatory Visit: Payer: Self-pay

## 2019-01-02 ENCOUNTER — Ambulatory Visit: Payer: Commercial Managed Care - PPO | Admitting: Physical Therapy

## 2019-01-02 DIAGNOSIS — M545 Low back pain, unspecified: Secondary | ICD-10-CM

## 2019-01-02 DIAGNOSIS — M542 Cervicalgia: Secondary | ICD-10-CM | POA: Diagnosis not present

## 2019-01-02 NOTE — Therapy (Signed)
[facility] [address] [place], [place], [address] Phone: [phone]   Fax:  [phone]  Physical Therapy Treatment  Patient Details  Name: Katrina Osborne MRN: [id number] Date of Birth: 11/05/1980 Referring Provider (PT): Katrina Alar MD.   Encounter Date: 01/02/2019  PT End of Session - 01/02/19 1350    Visit Number  5    Number of Visits  12    Date for PT Re-Evaluation  01/18/19    Authorization Type  FOTO AT LEAST EVERY 5TH VISIT. PROGRESS NOTE AT 10TH VISIT.    PT Start Time  0100    PT Stop Time  0203    PT Time Calculation (min)  63 min    Activity Tolerance  Patient tolerated treatment well    Behavior During Therapy  WFL for tasks assessed/performed       Past Medical History:  Diagnosis Date  . Hashimoto's disease   . Hashimoto's disease 09/22/2015  . History of herniated intervertebral disc   . Kidney stone   . Ovarian torsion   . Thyroid disease     Past Surgical History:  Procedure Laterality Date  . BACK SURGERY    . CHOLECYSTECTOMY    . TONSILLECTOMY      There were no vitals filed for this visit.  Subjective Assessment - 01/02/19 1349    Subjective  COVID-19  screen performed prior to patient entering clinic.  Patient states her mid-back is better and feels the red theraband has helped.    Patient Stated Goals  Get stamina up and decrease neck pain.    Currently in Pain?  Yes    Pain Score  4     Pain Location  Neck    Pain Orientation  Right;Left    Pain Descriptors / Indicators  Aching;Tightness    Pain Type  Surgical pain                       [facility] Adult PT Treatment/Exercise - 01/02/19 0001      Modalities   Modalities  Electrical Stimulation;Moist Heat;Ultrasound      Moist Heat Therapy   Number Minutes Moist Heat  20 Minutes    Moist Heat Location  Cervical      Electrical Stimulation   Electrical Stimulation Location  Bilateral cervical.    Electrical Stimulation  Action  IFC    Electrical Stimulation Parameters  80-150 Hz x 20 minutes.    Electrical Stimulation Goals  Tone;Pain      Ultrasound   Ultrasound Location  Bilateral UT's.    Ultrasound Parameters  Combo e'stim/U/S at 1.50 w/CM2 x 12 minutes.    Ultrasound Goals  Pain      Manual Therapy   Manual Therapy  Soft tissue mobilization    Soft tissue mobilization  In prone over pillow for comfort..patient received STW/M x 14 minutes with focus on reducing tone in bilateral UT's and cervical paraspinal musculature.                  PT Long Term Goals - 12/07/18 1444      PT LONG TERM GOAL #1   Title  Ind. with a HEP.    Time  6    Period  Weeks    Status  New      PT LONG TERM GOAL #2   Title  Perform ADL's with pain not > 2/10.    Time  6  Period  Weeks    Status  New      PT LONG TERM GOAL #3   Title  Bilateral active cervical rotation to 65 degrees.    Time  6    Period  Weeks    Status  New            Plan - 01/02/19 1358    Clinical Impression Statement  Patient doing very well with home exercise to scapular region with red theraband reporting less mid-back pain today.      PT Treatment/Interventions  ADLs/Self Care Home Management;Cryotherapy;Electrical Stimulation;Ultrasound;Moist Heat;Therapeutic activities;Therapeutic exercise;Patient/family education;Manual techniques    PT Next Visit Plan  STW/M; pulsed U/S at 3.3 Mhz; Please follow cervical fusion protocol.  UBE (progress to standing); UBE; ellitptical.    Consulted and Agree with Plan of Care  Patient       Patient will benefit from skilled therapeutic intervention in order to improve the following deficits and impairments:  Decreased activity tolerance, Pain, Decreased range of motion, Increased muscle spasms  Visit Diagnosis: Cervicalgia  Acute bilateral low back pain without sciatica     Problem List There are no active problems to display for this patient.   Katrina Osborne, Katrina Osborne  MPT 01/02/2019, 2:12 PM  [facility] [address] [place], [place], [address] Phone: [phone]   Fax:  [phone]  Name: Katrina SinnerCynthia Osborne MRN: [id number] Date of Birth: 11/28/1980

## 2019-01-04 ENCOUNTER — Encounter: Payer: Commercial Managed Care - PPO | Admitting: Physical Therapy

## 2019-01-06 ENCOUNTER — Ambulatory Visit: Payer: Commercial Managed Care - PPO | Admitting: Physical Therapy

## 2019-01-06 ENCOUNTER — Other Ambulatory Visit: Payer: Self-pay

## 2019-01-06 ENCOUNTER — Encounter: Payer: Self-pay | Admitting: Physical Therapy

## 2019-01-06 DIAGNOSIS — M542 Cervicalgia: Secondary | ICD-10-CM

## 2019-01-06 DIAGNOSIS — M545 Low back pain, unspecified: Secondary | ICD-10-CM

## 2019-01-06 NOTE — Therapy (Signed)
[facility] [address] [place], [place], [address] Phone: [phone]   Fax:  [phone]  Physical Therapy Treatment  Patient Details  Name: Katrina Osborne MRN: [id number] Date of Birth: [birth year] Referring Provider (PT): Marikay Alar MD.   Encounter Date: 01/06/2019  PT End of Session - 01/06/19 1213    Visit Number  6    Number of Visits  12    Date for PT Re-Evaluation  01/18/19    Authorization Type  FOTO AT LEAST EVERY 5TH VISIT. PROGRESS NOTE AT 10TH VISIT.    PT Start Time  1117    PT Stop Time  1216    PT Time Calculation (min)  59 min    Activity Tolerance  Patient tolerated treatment well    Behavior During Therapy  WFL for tasks assessed/performed       Past Medical History:  Diagnosis Date  . Hashimoto's disease   . Hashimoto's disease 09/22/2015  . History of herniated intervertebral disc   . Kidney stone   . Ovarian torsion   . Thyroid disease     Past Surgical History:  Procedure Laterality Date  . BACK SURGERY    . CHOLECYSTECTOMY    . TONSILLECTOMY      There were no vitals filed for this visit.  Subjective Assessment - 01/06/19 1202    Subjective  COVID-19  screen performed prior to patient entering clinic. My mid-back is much better.    Pertinent History  Lumbar surgery.    How long can you sit comfortably?  15-20 minutes.    Patient Stated Goals  Get stamina up and decrease neck pain.    Currently in Pain?  Yes    Pain Score  4     Pain Location  Neck    Pain Orientation  Right;Left                       [facility] Adult PT Treatment/Exercise - 01/06/19 0001      Exercises   Exercises  Shoulder      Shoulder Exercises: ROM/Strengthening   UBE (Upper Arm Bike)  8 minutes at 120 RPM's.      Modalities   Modalities  Electrical Stimulation;Moist Heat      Moist Heat Therapy   Number Minutes Moist Heat  20 Minutes    Moist Heat Location  Cervical      Electrical Stimulation    Electrical Stimulation Location  Bilateral UT's.    Electrical Stimulation Action  Pre-mod.    Electrical Stimulation Parameters  80-150 Hz x 20 minutes (5 sec on and 5 sec off).    Electrical Stimulation Goals  Tone;Pain      Manual Therapy   Manual Therapy  Soft tissue mobilization    Soft tissue mobilization  In prone: STW/M x 20 minutes to affected cervical musculature with bilateral UT trigger point release technique.                  PT Long Term Goals - 12/07/18 1444      PT LONG TERM GOAL #1   Title  Ind. with a HEP.    Time  6    Period  Weeks    Status  New      PT LONG TERM GOAL #2   Title  Perform ADL's with pain not > 2/10.    Time  6    Period  Weeks    Status  New      PT LONG TERM GOAL #3   Title  Bilateral active cervical rotation to 65 degrees.    Time  6    Period  Weeks    Status  New            Plan - 01/06/19 1210    Clinical Impression Statement  The patient is responding very well to treatmenyts and reports a significant reduction in scapular pain today.  She enjoyed the ON/off electrical stimulation protocol to bilateral UT's today.  She tolerated UBE without complaint.    Stability/Clinical Decision Making  Stable/Uncomplicated    PT Treatment/Interventions  ADLs/Self Care Home Management;Cryotherapy;Electrical Stimulation;Ultrasound;Moist Heat;Therapeutic activities;Therapeutic exercise;Patient/family education;Manual techniques    PT Next Visit Plan  STW/M; pulsed U/S at 3.3 Mhz; Please follow cervical fusion protocol.  UBE (progress to standing); UBE; ellitptical.    Consulted and Agree with Plan of Care  Patient       Patient will benefit from skilled therapeutic intervention in order to improve the following deficits and impairments:  Decreased activity tolerance, Pain, Decreased range of motion, Increased muscle spasms  Visit Diagnosis: Cervicalgia  Acute bilateral low back pain without sciatica     Problem  List There are no active problems to display for this patient.   APPLEGATE, [place] MPT 01/06/2019, 1:11 PM  [facility] [address] [place], [place], [address] Phone: [phone]   Fax:  [phone]  Name: Katrina SinnerCynthia Osborne MRN: [id number] Date of Birth: 02/09/1981

## 2019-01-10 ENCOUNTER — Ambulatory Visit: Payer: Commercial Managed Care - PPO | Admitting: Physical Therapy

## 2019-01-13 ENCOUNTER — Encounter: Payer: Self-pay | Admitting: Physical Therapy

## 2019-01-13 ENCOUNTER — Other Ambulatory Visit: Payer: Self-pay

## 2019-01-13 ENCOUNTER — Ambulatory Visit: Payer: Commercial Managed Care - PPO | Admitting: Physical Therapy

## 2019-01-13 DIAGNOSIS — M545 Low back pain, unspecified: Secondary | ICD-10-CM

## 2019-01-13 DIAGNOSIS — M542 Cervicalgia: Secondary | ICD-10-CM | POA: Diagnosis not present

## 2019-01-13 NOTE — Therapy (Signed)
[facility] [address] [place], [place], [address] Phone: [phone]   Fax:  [phone]  Physical Therapy Treatment  Patient Details  Name: Katrina Osborne MRN: [id number] Date of Birth: 1981-05-07 Referring Provider (PT): Marikay Alar MD.   Encounter Date: 01/13/2019  PT End of Session - 01/13/19 1227    Visit Number  7    Number of Visits  12    Date for PT Re-Evaluation  01/18/19    Authorization Type  FOTO AT LEAST EVERY 5TH VISIT. PROGRESS NOTE AT 10TH VISIT.    PT Start Time  1117    PT Stop Time  1215    PT Time Calculation (min)  58 min    Activity Tolerance  Patient tolerated treatment well    Behavior During Therapy  WFL for tasks assessed/performed       Past Medical History:  Diagnosis Date  . Hashimoto's disease   . Hashimoto's disease 09/22/2015  . History of herniated intervertebral disc   . Kidney stone   . Ovarian torsion   . Thyroid disease     Past Surgical History:  Procedure Laterality Date  . BACK SURGERY    . CHOLECYSTECTOMY    . TONSILLECTOMY      There were no vitals filed for this visit.  Subjective Assessment - 01/13/19 1229    Subjective  COVID-19 screen performed prior to patient entering clinic.  That last treatment helped a lot.  I worked two days in a row outside.  I did overdo it a bit though.    Pertinent History  Lumbar surgery.    How long can you sit comfortably?  15-20 minutes.    Patient Stated Goals  Get stamina up and decrease neck pain.    Currently in Pain?  Yes    Pain Score  3     Pain Location  Neck    Pain Orientation  Right;Left    Pain Descriptors / Indicators  Aching;Tightness                       [facility] Adult PT Treatment/Exercise - 01/13/19 0001      Modalities   Modalities  Electrical Stimulation;Moist Heat;Ultrasound      Moist Heat Therapy   Number Minutes Moist Heat  20 Minutes    Moist Heat Location  Cervical      Electrical  Stimulation   Electrical Stimulation Location  Bilateral UT's    Electrical Stimulation Action  Pre-mod.    Electrical Stimulation Parameters  80-150 Hz x 20 minutes (5 sec on and 5 sec off).    Electrical Stimulation Goals  Tone;Pain      Ultrasound   Ultrasound Location  Bilateral UT's.    Ultrasound Parameters  Combo e'stim/U/S at 1.50 W/CM2 x 12 minutes.    Ultrasound Goals  Pain      Manual Therapy   Manual Therapy  Soft tissue mobilization    Soft tissue mobilization  In prone:  STW/M x 14 minutes to bilateral UT's and mid-cervical paraspinal musculature with TP release technique to decrease tone.                  PT Long Term Goals - 12/07/18 1444      PT LONG TERM GOAL #1   Title  Ind. with a HEP.    Time  6    Period  Weeks    Status  New  PT LONG TERM GOAL #2   Title  Perform ADL's with pain not > 2/10.    Time  6    Period  Weeks    Status  New      PT LONG TERM GOAL #3   Title  Bilateral active cervical rotation to 65 degrees.    Time  6    Period  Weeks    Status  New            Plan - 01/13/19 1252    Clinical Impression Statement  Patient has responded very well to treatments.  She was found to have a reduction in tone over bilateral UT's today.  She is finding the treatments have allowed to to engage in more strenuous activites around her home.    PT Treatment/Interventions  ADLs/Self Care Home Management;Cryotherapy;Electrical Stimulation;Ultrasound;Moist Heat;Therapeutic activities;Therapeutic exercise;Patient/family education;Manual techniques    PT Next Visit Plan  STW/M; pulsed U/S at 3.3 Mhz; Please follow cervical fusion protocol.  UBE (progress to standing); UBE; ellitptical.    Consulted and Agree with Plan of Care  Patient       Patient will benefit from skilled therapeutic intervention in order to improve the following deficits and impairments:  Decreased activity tolerance, Pain, Decreased range of motion, Increased muscle  spasms  Visit Diagnosis: Cervicalgia  Acute bilateral low back pain without sciatica     Problem List There are no active problems to display for this patient.   Katrina Osborne, [place]  MPT 01/13/2019, 1:01 PM  [facility] [address] [place], [place], [address] Phone: [phone]   Fax:  [phone]  Name: Katrina Osborne MRN: [id number] Date of Birth: 03/19/1981

## 2019-01-17 ENCOUNTER — Other Ambulatory Visit: Payer: Self-pay

## 2019-01-17 ENCOUNTER — Ambulatory Visit: Payer: Commercial Managed Care - PPO | Admitting: Physical Therapy

## 2019-01-17 DIAGNOSIS — M542 Cervicalgia: Secondary | ICD-10-CM | POA: Diagnosis not present

## 2019-01-17 DIAGNOSIS — M545 Low back pain, unspecified: Secondary | ICD-10-CM

## 2019-01-17 NOTE — Therapy (Signed)
[facility] [address] [place], [place], [address] Phone: [phone]   Fax:  [phone]  Physical Therapy Treatment  Patient Details  Name: Katrina Osborne MRN: [id number] Date of Birth: 1980-12-01 Referring Provider (PT): Marikay Alar MD.   Encounter Date: 01/17/2019  PT End of Session - 01/17/19 1215    Visit Number  8    Number of Visits  12    Date for PT Re-Evaluation  01/18/19    Authorization Type  FOTO AT LEAST EVERY 5TH VISIT. PROGRESS NOTE AT 10TH VISIT.    PT Start Time  1017    PT Stop Time  1116    PT Time Calculation (min)  59 min       Past Medical History:  Diagnosis Date  . Hashimoto's disease   . Hashimoto's disease 09/22/2015  . History of herniated intervertebral disc   . Kidney stone   . Ovarian torsion   . Thyroid disease     Past Surgical History:  Procedure Laterality Date  . BACK SURGERY    . CHOLECYSTECTOMY    . TONSILLECTOMY      There were no vitals filed for this visit.  Subjective Assessment - 01/17/19 1216    Subjective  COVID-19 screen performed prior to patient entering clinic.  Have a headache.    Pertinent History  Lumbar surgery.    How long can you sit comfortably?  15-20 minutes.    Currently in Pain?  Yes    Pain Score  5     Pain Location  Neck   Head.   Pain Orientation  Right;Left    Pain Descriptors / Indicators  Aching;Tightness    Pain Type  Surgical pain                       [facility] Adult PT Treatment/Exercise - 01/17/19 0001      Modalities   Modalities  Electrical Stimulation;Moist Heat;Ultrasound      Moist Heat Therapy   Number Minutes Moist Heat  20 Minutes    Moist Heat Location  Cervical      Electrical Stimulation   Electrical Stimulation Location  Bilateral UT's.    Electrical Stimulation Action  Pre-mod    Electrical Stimulation Parameters  80-150 Hz x 20 minutes (5 sec on and 5 sec off).    Electrical Stimulation Goals  Tone;Pain       Ultrasound   Ultrasound Location  Bilateral UT's.    Ultrasound Parameters  U/S at 1.50 W/CM2 x 12 minutes to bilateral UT's.    Ultrasound Goals  Pain      Manual Therapy   Manual Therapy  Soft tissue mobilization    Soft tissue mobilization  In prone and supine:  STW/M to bilateral UT's including right UT trigger point release technique and gentle suboccipital release technique as well x 18 minutes.                  PT Long Term Goals - 12/07/18 1444      PT LONG TERM GOAL #1   Title  Ind. with a HEP.    Time  6    Period  Weeks    Status  New      PT LONG TERM GOAL #2   Title  Perform ADL's with pain not > 2/10.    Time  6    Period  Weeks    Status  New  PT LONG TERM GOAL #3   Title  Bilateral active cervical rotation to 65 degrees.    Time  6    Period  Weeks    Status  New            Plan - 01/17/19 1226    Clinical Impression Statement  Patient felt much better after treatment with a significnat reduction in her headache.  Her right UT continues to have a trigger point.    PT Treatment/Interventions  ADLs/Self Care Home Management;Cryotherapy;Electrical Stimulation;Ultrasound;Moist Heat;Therapeutic activities;Therapeutic exercise;Patient/family education;Manual techniques    PT Next Visit Plan  STW/M; pulsed U/S at 3.3 Mhz; Please follow cervical fusion protocol.  UBE (progress to standing); UBE; ellitptical.    Consulted and Agree with Plan of Care  Patient       Patient will benefit from skilled therapeutic intervention in order to improve the following deficits and impairments:     Visit Diagnosis: Cervicalgia  Acute bilateral low back pain without sciatica     Problem List There are no active problems to display for this patient.   , [place] MPT 01/17/2019, 12:27 PM  [facility] [address] [place], [place], [address] Phone: [phone]   Fax:  [phone]  Name:  Katrina SinnerCynthia Osborne MRN: [id number] Date of Birth: 02/06/1981

## 2019-01-20 ENCOUNTER — Ambulatory Visit: Payer: Commercial Managed Care - PPO | Admitting: Physical Therapy

## 2019-01-25 ENCOUNTER — Encounter: Payer: Commercial Managed Care - PPO | Admitting: Physical Therapy

## 2019-01-31 ENCOUNTER — Ambulatory Visit: Payer: Commercial Managed Care - PPO | Attending: Neurological Surgery | Admitting: Physical Therapy

## 2019-01-31 ENCOUNTER — Other Ambulatory Visit: Payer: Self-pay

## 2019-01-31 DIAGNOSIS — M545 Low back pain, unspecified: Secondary | ICD-10-CM

## 2019-01-31 DIAGNOSIS — M542 Cervicalgia: Secondary | ICD-10-CM | POA: Diagnosis not present

## 2019-01-31 NOTE — Therapy (Signed)
[facility] [address] [place], [place], [address] Phone: [phone]   Fax:  [phone]  Physical Therapy Treatment  Patient Details  Name: Sharalee Wieman MRN: [id number] Date of Birth: 1980/10/22 Referring Provider (PT): Marikay Alar MD.   Encounter Date: 01/31/2019  PT End of Session - 01/31/19 1211    Visit Number  9    Number of Visits  12    Date for PT Re-Evaluation  01/18/19    Authorization Type  FOTO AT LEAST EVERY 5TH VISIT. PROGRESS NOTE AT 10TH VISIT.    PT Start Time  1117    PT Stop Time  1219    PT Time Calculation (min)  62 min    Activity Tolerance  Patient tolerated treatment well    Behavior During Therapy  WFL for tasks assessed/performed       Past Medical History:  Diagnosis Date  . Hashimoto's disease   . Hashimoto's disease 09/22/2015  . History of herniated intervertebral disc   . Kidney stone   . Ovarian torsion   . Thyroid disease     Past Surgical History:  Procedure Laterality Date  . BACK SURGERY    . CHOLECYSTECTOMY    . TONSILLECTOMY      There were no vitals filed for this visit.  Subjective Assessment - 01/31/19 1206    Subjective  COVID-19 screen performed prior to patient entering clinic. I took a return to work  test that required heavy lifting    Pertinent History  Lumbar surgery.    How long can you sit comfortably?  15-20 minutes.    Patient Stated Goals  Get stamina up and decrease neck pain.    Currently in Pain?  Yes    Pain Score  8     Pain Location  Neck    Pain Orientation  Right    Pain Descriptors / Indicators  Aching;Tightness    Pain Type  Surgical pain                       [facility] Adult PT Treatment/Exercise - 01/31/19 0001      Modalities   Modalities  Electrical Stimulation;Moist Heat;Ultrasound      Moist Heat Therapy   Number Minutes Moist Heat  20 Minutes    Moist Heat Location  --   Right UT/medial scap border     Electrical  Stimulation   Electrical Stimulation Location  Right UT/Med scap border.    Electrical Stimulation Action  IFC    Electrical Stimulation Parameters  80-150 Hz x 20 minutes.    Electrical Stimulation Goals  Tone;Pain      Ultrasound   Ultrasound Location  Right UT/medial scapular border.    Ultrasound Parameters  U/S at 1.50 W/CM2  x 12 minutes    Ultrasound Goals  Pain      Manual Therapy   Manual Therapy  Soft tissue mobilization    Soft tissue mobilization  In prone:  STW/M to right medial scapular border and right UT x 12 minutes to reduce tone.                  PT Long Term Goals - 12/07/18 1444      PT LONG TERM GOAL #1   Title  Ind. with a HEP.    Time  6    Period  Weeks    Status  New      PT LONG TERM GOAL #  2   Title  Perform ADL's with pain not > 2/10.    Time  6    Period  Weeks    Status  New      PT LONG TERM GOAL #3   Title  Bilateral active cervical rotation to 65 degrees.    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  She passed the test which required heavy lifting.  Most of her pain and tenderness was in the region of of her right UT and medial scpaulr border region.  She did very well with treatment today.    PT Treatment/Interventions  ADLs/Self Care Home Management;Cryotherapy;Electrical Stimulation;Ultrasound;Moist Heat;Therapeutic activities;Therapeutic exercise;Patient/family education;Manual techniques    PT Next Visit Plan  STW/M; pulsed U/S at 3.3 Mhz; Please follow cervical fusion protocol.  UBE (progress to standing); UBE; ellitptical.    Consulted and Agree with Plan of Care  Patient       Patient will benefit from skilled therapeutic intervention in order to improve the following deficits and impairments:  Decreased activity tolerance, Pain, Decreased range of motion, Increased  muscle spasms  Visit Diagnosis: Cervicalgia  Acute bilateral low back pain without sciatica     Problem List There are no active problems to display for this patient.   Phillipa Morden, [place] MPT 01/31/2019, 1:14 PM  [facility] [address] [place], [place], [address] Phone: [phone]   Fax:  [phone]  Name: Royston SinnerCynthia Defreitas MRN: [id number] Date of Birth: 03/08/1981

## 2019-02-08 ENCOUNTER — Other Ambulatory Visit: Payer: Self-pay

## 2019-02-08 ENCOUNTER — Ambulatory Visit: Payer: Commercial Managed Care - PPO | Admitting: Physical Therapy

## 2019-02-08 DIAGNOSIS — M542 Cervicalgia: Secondary | ICD-10-CM

## 2019-02-08 DIAGNOSIS — M545 Low back pain, unspecified: Secondary | ICD-10-CM

## 2019-02-08 NOTE — Therapy (Signed)
[facility] [address] [place], [place], [address] Phone: [phone]   Fax:  [phone]  Physical Therapy Treatment  Patient Details  Name: Katrina Osborne MRN: [id number] Date of Birth: 04/03/1981 Referring Provider (PT): Marikay Alar MD.   Encounter Date: 02/08/2019  PT End of Session - 02/08/19 1111    Visit Number  10    Number of Visits  12    Date for PT Re-Evaluation  03/01/19    Authorization Type  FOTO AT LEAST EVERY 5TH VISIT. PROGRESS NOTE AT 10TH VISIT.    PT Start Time  [phone]    PT Stop Time  1059    PT Time Calculation (min)  67 min    Activity Tolerance  Patient tolerated treatment well    Behavior During Therapy  WFL for tasks assessed/performed       Past Medical History:  Diagnosis Date  . Hashimoto's disease   . Hashimoto's disease 09/22/2015  . History of herniated intervertebral disc   . Kidney stone   . Ovarian torsion   . Thyroid disease     Past Surgical History:  Procedure Laterality Date  . BACK SURGERY    . CHOLECYSTECTOMY    . TONSILLECTOMY      There were no vitals filed for this visit.  Subjective Assessment - 02/08/19 1059    Subjective  COVID-19 screen performed prior to patient entering clinic.  I was pain-free for several days after last treatment.  My pain has risen since returning to work which requires heavy lifting.    Pertinent History  Lumbar surgery.    How long can you sit comfortably?  15-20 minutes.    Patient Stated Goals  Get stamina up and decrease neck pain.    Currently in Pain?  Yes    Pain Score  5     Pain Orientation  Right    Pain Descriptors / Indicators  Aching;Tightness    Pain Type  Surgical pain                       [facility] Adult PT Treatment/Exercise - 02/08/19 0001      Modalities   Modalities  Electrical Stimulation;Moist Heat;Ultrasound      Moist Heat Therapy   Number Minutes Moist Heat  20 Minutes    Moist Heat Location  --    Bilateral UT's.     Hospital doctor Action  Pre-mod to each side    Electrical Stimulation Parameters  80-150 Hz x 20 minutes (5 sec on and 5 sec off).    Electrical Stimulation Goals  Tone      Ultrasound   Ultrasound Location  Bilateral UT's.    Ultrasound Parameters  U/S at 1.50 W/CM2 x 12 minutes.      Manual Therapy   Manual Therapy  Soft tissue mobilization    Soft tissue mobilization  In prone:  STW/M including ischemic release technique to bilateral UT's and right medila scapular border x 20 minutes.                  PT Long Term Goals - 02/08/19 1112      PT LONG TERM GOAL #1   Title  Ind. with a HEP.    Time  6    Period  Weeks    Status  Achieved      PT LONG TERM  GOAL #2   Title  Perform ADL's with pain not > 2/10.    Time  6    Period  Weeks    Status  On-going      PT LONG TERM GOAL #3   Baseline  Right= 65 degrees and left= 58 degrees.    Time  6    Period  Weeks    Status  On-going            Plan - 02/08/19 1115    Clinical Impression Statement  See "Therapy Note" section.    PT Treatment/Interventions  ADLs/Self Care Home Management;Cryotherapy;Electrical Stimulation;Ultrasound;Moist Heat;Therapeutic activities;Therapeutic exercise;Patient/family education;Manual techniques    PT Next Visit Plan  STW/M; pulsed U/S at 3.3 Mhz; Please follow cervical fusion protocol.  UBE (progress to standing); UBE; ellitptical.    Consulted and Agree with Plan of Care  Patient       Patient will benefit from skilled therapeutic intervention in order to improve the following deficits and impairments:  Decreased activity tolerance, Pain, Decreased range of motion, Increased muscle spasms  Visit Diagnosis: Cervicalgia - Plan: PT plan of care cert/re-cert  Acute bilateral low back pain without sciatica - Plan: PT plan of care cert/re-cert     Problem List There  are no active problems to display for this patient.   Progress Note Reporting Period 12/07/18 to 02/08/19  See note below for Objective Data and Assessment of Progress/Goals. Patient is making excellent progress overall.  Her pain is consistently lower and she has returned to a job that require heavy lifting.  Her right active cervical rotation has improved as well.       Carmine Youngberg, [place] MPT 02/08/2019, 11:29 AM  [facility] [address] [place], [place], [address] Phone: [phone]   Fax:  [phone]  Name: Royston SinnerCynthia Angst MRN: [id number] Date of Birth: 08/26/1981

## 2019-02-14 ENCOUNTER — Ambulatory Visit: Payer: Commercial Managed Care - PPO | Admitting: Physical Therapy

## 2019-02-14 DIAGNOSIS — M542 Cervicalgia: Secondary | ICD-10-CM | POA: Diagnosis not present

## 2019-02-14 DIAGNOSIS — M545 Low back pain, unspecified: Secondary | ICD-10-CM

## 2019-02-14 NOTE — Therapy (Signed)
[facility] [address] [place], [place], [address] Phone: [phone]   Fax:  [phone]  Physical Therapy Treatment  Patient Details  Name: Katrina SinnerCynthia Osborne MRN: [id number] Date of Birth: 05/04/1981 Referring Provider (PT): Marikay Alaravid Jones MD.   Encounter Date: 02/14/2019  PT End of Session - 02/14/19 1620    Visit Number  11    Number of Visits  12    Date for PT Re-Evaluation  03/01/19    Authorization Type  FOTO AT LEAST EVERY 5TH VISIT. PROGRESS NOTE AT 10TH VISIT.    PT Start Time  0300    PT Stop Time  0404    PT Time Calculation (min)  64 min    Activity Tolerance  Patient tolerated treatment well    Behavior During Therapy  WFL for tasks assessed/performed       Past Medical History:  Diagnosis Date  . Hashimoto's disease   . Hashimoto's disease 09/22/2015  . History of herniated intervertebral disc   . Kidney stone   . Ovarian torsion   . Thyroid disease     Past Surgical History:  Procedure Laterality Date  . BACK SURGERY    . CHOLECYSTECTOMY    . TONSILLECTOMY      There were no vitals filed for this visit.  Subjective Assessment - 02/14/19 1600    Subjective  COVID-19 screen performed prior to patient entering clinic.  Been working very hard.  Got a headche.    Pertinent History  Lumbar surgery.    How long can you sit comfortably?  15-20 minutes.    Patient Stated Goals  Get stamina up and decrease neck pain.    Currently in Pain?  Yes    Pain Score  5     Pain Location  Neck    Pain Orientation  Right    Pain Descriptors / Indicators  Aching;Tightness    Pain Type  Surgical pain                       [facility] Adult PT Treatment/Exercise - 02/14/19 0001      Modalities   Modalities  Electrical Stimulation;Moist Heat;Ultrasound      Moist Heat Therapy   Number Minutes Moist Heat  20 Minutes    Moist Heat Location  --   Right UT/medial scapular border.     Programme researcher, broadcasting/film/videolectrical Stimulation   Electrical Stimulation Location  Right UT/medial scpaulr border.    Electrical Stimulation Action  IFC    Electrical Stimulation Parameters  80-150 Hz x 20 minutes.    Electrical Stimulation Goals  Tone;Pain      Ultrasound   Ultrasound Location  Right UT/medial scapular border.    Ultrasound Parameters  U/S at 1.50 W/CM2 x 12 minutes.    Ultrasound Goals  Pain      Manual Therapy   Manual Therapy  Soft tissue mobilization    Soft tissue mobilization  In prone:  STW/M to right suboccipital region/UT and medial scapular border x 17 minutes to decrease tone.                  PT Long Term Goals - 02/08/19 1112      PT LONG TERM GOAL #1   Title  Ind. with a HEP.    Time  6    Period  Weeks    Status  Achieved      PT LONG TERM GOAL #2   Title  Perform ADL's with pain not > 2/10.    Time  6    Period  Weeks    Status  On-going      PT LONG TERM GOAL #3   Baseline  Right= 65 degrees and left= 58 degrees.    Time  6    Period  Weeks    Status  On-going            Plan - 02/14/19 1621    Clinical Impression Statement  Patient felt much better after treatment.  Right UT has much less tone now.  Right suboccipital and musculature in right medial scapular border region responded very well to STW/M.  She asked about a home technique to reduce "knots" and I suggested she look into getting a Theracane.    Stability/Clinical Decision Making  Stable/Uncomplicated    PT Treatment/Interventions  ADLs/Self Care Home Management;Cryotherapy;Electrical Stimulation;Ultrasound;Moist Heat;Therapeutic activities;Therapeutic exercise;Patient/family education;Manual techniques    PT Next Visit Plan  STW/M; pulsed U/S at 3.3 Mhz; Please follow cervical fusion protocol.  UBE (progress to standing); UBE; ellitptical.    Consulted and Agree with Plan of Care  Patient       Patient will benefit from skilled therapeutic intervention in order to improve the following deficits and  impairments:  Decreased activity tolerance, Pain, Decreased range of motion, Increased muscle spasms  Visit Diagnosis: Cervicalgia  Acute bilateral low back pain without sciatica     Problem List There are no active problems to display for this patient.   APPLEGATE, [place] MPT 02/14/2019, 4:24 PM  [facility] [address] [place], [place], [address] Phone: [phone]   Fax:  [phone]  Name: Katrina Osborne MRN: [id number] Date of Birth: 10/26/1980

## 2019-02-17 ENCOUNTER — Ambulatory Visit: Payer: Commercial Managed Care - PPO | Admitting: Physical Therapy

## 2019-02-17 ENCOUNTER — Other Ambulatory Visit: Payer: Self-pay

## 2019-02-17 DIAGNOSIS — M545 Low back pain, unspecified: Secondary | ICD-10-CM

## 2019-02-17 DIAGNOSIS — M542 Cervicalgia: Secondary | ICD-10-CM

## 2019-02-17 NOTE — Therapy (Signed)
[facility] [address] [place], [place], [address] Phone: [phone]   Fax:  [phone]  Physical Therapy Treatment  Patient Details  Name: Katrina Osborne MRN: [id number] Date of Birth: 11-01-1980 Referring Provider (PT): Marikay Alar MD.   Encounter Date: 02/17/2019  PT End of Session - 02/17/19 1221    Visit Number  12    Number of Visits  12    Date for PT Re-Evaluation  03/01/19    Authorization Type  FOTO AT LEAST EVERY 5TH VISIT. PROGRESS NOTE AT 10TH VISIT.    PT Start Time  1020    PT Stop Time  1131    PT Time Calculation (min)  71 min    Activity Tolerance  Patient tolerated treatment well    Behavior During Therapy  WFL for tasks assessed/performed       Past Medical History:  Diagnosis Date  . Hashimoto's disease   . Hashimoto's disease 09/22/2015  . History of herniated intervertebral disc   . Kidney stone   . Ovarian torsion   . Thyroid disease     Past Surgical History:  Procedure Laterality Date  . BACK SURGERY    . CHOLECYSTECTOMY    . TONSILLECTOMY      There were no vitals filed for this visit.  Subjective Assessment - 02/17/19 1159    Subjective  COVID-19 screen performed prior to patient entering clinic.  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  Some increased pain has well.  My Rheumatologist diagnosed with with Lupus and states this is contributing to my pain.    Pertinent History  Lumbar surgery.    How long can you sit comfortably?  15-20 minutes.    Patient Stated Goals  Get stamina up and decrease neck pain.    Currently in Pain?  Yes    Pain Score  6     Pain Location  Neck    Pain Orientation  Right;Left    Pain Descriptors / Indicators  Aching                       [facility] Adult PT Treatment/Exercise - 02/17/19 0001      Modalities   Modalities  Electrical Stimulation;Moist Heat;Ultrasound      Moist Heat Therapy   Number Minutes Moist Heat  20 Minutes    Moist  Heat Location  --   C and T-spine.     Programme researcher, broadcasting/film/video Location  Bilateralmedial scapular border.    Electrical Stimulation Action  Pre-mod.    Electrical Stimulation Parameters  80-150 Hz (5 sec on and 5 sec off).    Electrical Stimulation Goals  Tone;Pain      Ultrasound   Ultrasound Location  Right UT/Medial scapular border.    Ultrasound Parameters  U/S at 1.50 W/CM2 x 12 minutes.      Manual Therapy   Manual Therapy  Soft tissue mobilization    Soft tissue mobilization  In prone:  STW/M x 23 minutes to bilateral UT's, cervical paraspinals including suboccipital region and medial scapular border (majority of time spent on right side).                  PT Long Term Goals - 02/08/19 1112      PT LONG TERM GOAL #1   Title  Ind. with a HEP.    Time  6    Period  Weeks  Status  Achieved      PT LONG TERM GOAL #2   Title  Perform ADL's with pain not > 2/10.    Time  6    Period  Weeks    Status  On-going      PT LONG TERM GOAL #3   Baseline  Right= 65 degrees and left= 58 degrees.    Time  6    Period  Weeks    Status  On-going            Plan - 02/17/19 1220    Clinical Impression Statement  Patient with an increase in pain today as she had to perform heavy lifting at her job last night.  She did well with treatment today and has a notable decrease in muscle tone since starting PT.       Patient will benefit from skilled therapeutic intervention in order to improve the following deficits and impairments:  Decreased activity tolerance, Pain, Decreased range of motion, Increased muscle spasms  Visit Diagnosis: Cervicalgia  Acute bilateral low back pain without sciatica     Problem List There are no active problems to display for this patient.   , [place] MPT 02/17/2019, 12:23 PM  [facility] [address] [place], [place], [address] Phone: [phone]   Fax:   [phone]  Name: Katrina SinnerCynthia Osborne MRN: [id number] Date of Birth: 07/19/1981

## 2019-02-22 ENCOUNTER — Encounter: Payer: Commercial Managed Care - PPO | Admitting: Physical Therapy

## 2019-02-28 ENCOUNTER — Ambulatory Visit: Payer: Commercial Managed Care - PPO | Attending: Neurological Surgery | Admitting: Physical Therapy

## 2019-02-28 ENCOUNTER — Other Ambulatory Visit: Payer: Self-pay

## 2019-02-28 ENCOUNTER — Encounter: Payer: Self-pay | Admitting: Physical Therapy

## 2019-02-28 DIAGNOSIS — M542 Cervicalgia: Secondary | ICD-10-CM | POA: Diagnosis not present

## 2019-02-28 DIAGNOSIS — M545 Low back pain, unspecified: Secondary | ICD-10-CM

## 2019-02-28 NOTE — Therapy (Signed)
[facility] [place], [place], [address] Phone: [phone]   Fax:  [phone]  Physical Therapy Evaluation  Patient Details  Name: Katrina Osborne MRN: [id number] Date of Birth: [birth year] Referring Provider (PT): Sherley Bounds MD.   Encounter Date: 02/28/2019  PT End of Session - 02/28/19 1422    Visit Number  12    Number of Visits  16    Date for PT Re-Evaluation  03/29/19    Authorization Type  FOTO AT LEAST EVERY 5TH VISIT. PROGRESS NOTE AT 10TH VISIT.    PT Start Time  1016    PT Stop Time  1108    PT Time Calculation (min)  52 min    Activity Tolerance  Patient tolerated treatment well    Behavior During Therapy  WFL for tasks assessed/performed       Past Medical History:  Diagnosis Date  . Hashimoto's disease   . Hashimoto's disease 09/22/2015  . History of herniated intervertebral disc   . Kidney stone   . Ovarian torsion   . Thyroid disease     Past Surgical History:  Procedure Laterality Date  . BACK SURGERY    . CHOLECYSTECTOMY    . TONSILLECTOMY      There were no vitals filed for this visit.   Subjective Assessment - 02/28/19 1422    Subjective  COVID-19 screen performed prior to patient entering clinic.  Pain at a 4/10 today.    Patient Stated Goals  Get stamina up and decrease neck pain.    Currently in Pain?  Yes    Pain Score  4     Pain Location  Neck    Pain Orientation  Right;Left                    Objective measurements completed on examination: See above findings.      [facility] Adult PT Treatment/Exercise - 02/28/19 0001      Modalities   Modalities  Electrical Stimulation;Moist Heat      Moist Heat Therapy   Number Minutes Moist Heat  15 Minutes    Moist Heat Location  --   C-T spine.     Acupuncturist Location  Bilateral UT's/medial scapular border.    Electrical Stimulation Action  Pre-mod.    Electrical Stimulation  Parameters  80-150 Hz (5 sec on and 5 sec off) x 15 minutes.    Electrical Stimulation Goals  Tone;Pain      Manual Therapy   Manual Therapy  Soft tissue mobilization    Soft tissue mobilization  In prone:  STW/M to bilateral suboccipital, cervical paraspinals, UT's and mid-scapular musculature x 24 minutes.                  PT Long Term Goals - 02/08/19 1112      PT LONG TERM GOAL #1   Title  Ind. with a HEP.    Time  6    Period  Weeks    Status  Achieved      PT LONG TERM GOAL #2   Title  Perform ADL's with pain not > 2/10.    Time  6    Period  Weeks    Status  On-going      PT LONG TERM GOAL #3   Baseline  Right= 65 degrees and left= 58 degrees.    Time  6    Period  Weeks  Status  On-going             Plan - 02/28/19 1426    Clinical Impression Statement  Patient did well with treatment today.  Tone and muscular pain increases after a strenuous shift at work which requires heavy lifting.    Examination-Participation Restrictions  Yard Work    Stability/Clinical Decision Making  Stable/Uncomplicated    PT Treatment/Interventions  ADLs/Self Care Home Management;Cryotherapy;Electrical Stimulation;Ultrasound;Moist Heat;Therapeutic activities;Therapeutic exercise;Patient/family education;Manual techniques    PT Next Visit Plan  STW/M; pulsed U/S at 3.3 Mhz; Please follow cervical fusion protocol.  UBE (progress to standing); UBE; ellitptical.    Consulted and Agree with Plan of Care  Patient       Patient will benefit from skilled therapeutic intervention in order to improve the following deficits and impairments:  Decreased activity tolerance, Pain, Decreased range of motion, Increased muscle spasms  Visit Diagnosis: Cervicalgia - Plan: PT plan of care cert/re-cert  Acute bilateral low back pain without sciatica - Plan: PT plan of care cert/re-cert     Problem List There are no active problems to display for this patient.   Edker Punt, [place]  MPT 02/28/2019, 2:29 PM  [facility] [address] [place], [place], [address] Phone: [phone]   Fax:  [phone]  Name: Katrina SinnerCynthia Osborne MRN: [id number] Date of Birth: 03/12/1981

## 2019-03-08 ENCOUNTER — Other Ambulatory Visit: Payer: Self-pay

## 2019-03-08 ENCOUNTER — Ambulatory Visit: Payer: Commercial Managed Care - PPO | Admitting: Physical Therapy

## 2019-03-08 DIAGNOSIS — M545 Low back pain, unspecified: Secondary | ICD-10-CM

## 2019-03-08 DIAGNOSIS — M542 Cervicalgia: Secondary | ICD-10-CM | POA: Diagnosis not present

## 2019-03-08 NOTE — Therapy (Addendum)
[facility] [place], [place], [address] Phone: [phone]   Fax:  [phone]  Physical Therapy Treatment  Patient Details  Name: Katrina Osborne MRN: [id number] Date of Birth: 1981-05-06 Referring Provider (PT): Sherley Bounds MD.   Encounter Date: 03/08/2019  PT End of Session - 03/08/19 1127    Visit Number  13    Number of Visits  16    Date for PT Re-Evaluation  03/29/19    Authorization Type  FOTO AT LEAST EVERY 5TH VISIT. PROGRESS NOTE AT 10TH VISIT.    PT Start Time  1017    PT Stop Time  1116    PT Time Calculation (min)  59 min    Activity Tolerance  Patient tolerated treatment well    Behavior During Therapy  WFL for tasks assessed/performed       Past Medical History:  Diagnosis Date  . Hashimoto's disease   . Hashimoto's disease 09/22/2015  . History of herniated intervertebral disc   . Kidney stone   . Ovarian torsion   . Thyroid disease     Past Surgical History:  Procedure Laterality Date  . BACK SURGERY    . CHOLECYSTECTOMY    . TONSILLECTOMY      There were no vitals filed for this visit.  Subjective Assessment - 03/08/19 1129    Subjective  COVID-19 screen performed prior to patient entering clinic.  In a lot of pain today. I think its the Lupus.  Not able to take the pain medication for it yet as it makes me sick.  Told doctor and he said take 2 weeks off from the medication and then restart with a lesser dose.    Pertinent History  Lumbar surgery.    How long can you sit comfortably?  15-20 minutes.    Patient Stated Goals  Get stamina up and decrease neck pain.    Currently in Pain?  Yes    Pain Score  8     Pain Location  Neck    Pain Descriptors / Indicators  Aching;Throbbing    Pain Type  Surgical pain    Pain Onset  More than a month ago    Pain Frequency  Constant                       [facility] Adult PT Treatment/Exercise - 03/08/19 0001      Modalities   Modalities  Electrical Stimulation;Moist Heat      Moist Heat Therapy   Number Minutes Moist Heat  20 Minutes    Moist Heat Location  --   Bilateral neck.     Acupuncturist Location  Bilateral UT's.    Electrical Stimulation Action  Pre-mod    Electrical Stimulation Parameters  80-150 Hz x 20 minutes (5 sec on and 5 sec off).      Manual Therapy   Manual Therapy  Soft tissue mobilization    Soft tissue mobilization  In prone:  STW/M x 28 minutes to bilateral UT's amd upper scapular musculature.                  PT Long Term Goals - 02/08/19 1112      PT LONG TERM GOAL #1   Title  Ind. with a HEP.    Time  6    Period  Weeks    Status  Achieved      PT LONG TERM  GOAL #2   Title  Perform ADL's with pain not > 2/10.    Time  6    Period  Weeks    Status  On-going      PT LONG TERM GOAL #3   Baseline  Right= 65 degrees and left= 58 degrees.    Time  6    Period  Weeks    Status  On-going            Plan - 03/08/19 1138    Clinical Impression Statement  Patient with notable increase in tone over bilateral UT's.  Patientr feels it is related to her Lupus.  She did well with STW/M x felt better after treatment.    PT Treatment/Interventions  ADLs/Self Care Home Management;Cryotherapy;Electrical Stimulation;Ultrasound;Moist Heat;Therapeutic activities;Therapeutic exercise;Patient/family education;Manual techniques    PT Next Visit Plan  STW/M; pulsed U/S at 3.3 Mhz; Please follow cervical fusion protocol.  UBE (progress to standing); UBE; ellitptical.       Patient will benefit from skilled therapeutic intervention in order to improve the following deficits and impairments:     Visit Diagnosis: 1. Cervicalgia   2. Acute bilateral low back pain without sciatica        Problem List There are no active problems to display for this patient.   Friend Dorfman, [place] MPT 03/08/2019, 11:40 AM  [facility] [address] [place], [place], [address] Phone: [phone]   Fax:  [phone]  Name: Letty Salvi MRN: [id number] Date of Birth: 06-23-1981    PHYSICAL THERAPY DISCHARGE SUMMARY  Visits from Start of Care: 13.  Current functional level related to goals / functional outcomes: See above.   Remaining deficits: See below.   Education / Equipment: HEP. Plan: Patient agrees to discharge.  Patient goals were partially met. Patient is being discharged due to meeting the stated rehab goals.  ?????         Mali Dovber Ernest MPT

## 2019-03-17 ENCOUNTER — Encounter: Payer: Commercial Managed Care - PPO | Admitting: Physical Therapy

## 2019-05-09 ENCOUNTER — Other Ambulatory Visit: Payer: Self-pay

## 2019-05-09 DIAGNOSIS — Z20822 Contact with and (suspected) exposure to covid-19: Secondary | ICD-10-CM

## 2019-05-10 LAB — NOVEL CORONAVIRUS, NAA: SARS-CoV-2, NAA: NOT DETECTED

## 2019-05-29 ENCOUNTER — Emergency Department (HOSPITAL_COMMUNITY): Payer: Commercial Managed Care - PPO

## 2019-05-29 ENCOUNTER — Emergency Department (HOSPITAL_COMMUNITY)
Admission: EM | Admit: 2019-05-29 | Discharge: 2019-05-30 | Disposition: A | Discharge status: Home / Self Care | Payer: Commercial Managed Care - PPO | Attending: Emergency Medicine | Admitting: Emergency Medicine

## 2019-05-29 ENCOUNTER — Other Ambulatory Visit: Payer: Self-pay

## 2019-05-29 ENCOUNTER — Encounter (HOSPITAL_COMMUNITY): Payer: Self-pay | Admitting: *Deleted

## 2019-05-29 DIAGNOSIS — R1030 Lower abdominal pain, unspecified: Secondary | ICD-10-CM | POA: Diagnosis not present

## 2019-05-29 DIAGNOSIS — Z87891 Personal history of nicotine dependence: Secondary | ICD-10-CM | POA: Insufficient documentation

## 2019-05-29 DIAGNOSIS — Z20828 Contact with and (suspected) exposure to other viral communicable diseases: Secondary | ICD-10-CM | POA: Diagnosis not present

## 2019-05-29 DIAGNOSIS — Z79899 Other long term (current) drug therapy: Secondary | ICD-10-CM | POA: Insufficient documentation

## 2019-05-29 DIAGNOSIS — Z7901 Long term (current) use of anticoagulants: Secondary | ICD-10-CM | POA: Diagnosis not present

## 2019-05-29 DIAGNOSIS — R110 Nausea: Secondary | ICD-10-CM

## 2019-05-29 DIAGNOSIS — M791 Myalgia, unspecified site: Secondary | ICD-10-CM | POA: Diagnosis not present

## 2019-05-29 DIAGNOSIS — E039 Hypothyroidism, unspecified: Secondary | ICD-10-CM | POA: Insufficient documentation

## 2019-05-29 DIAGNOSIS — I82409 Acute embolism and thrombosis of unspecified deep veins of unspecified lower extremity: Secondary | ICD-10-CM | POA: Diagnosis not present

## 2019-05-29 DIAGNOSIS — R197 Diarrhea, unspecified: Secondary | ICD-10-CM

## 2019-05-29 DIAGNOSIS — I2699 Other pulmonary embolism without acute cor pulmonale: Secondary | ICD-10-CM | POA: Diagnosis not present

## 2019-05-29 DIAGNOSIS — K529 Noninfective gastroenteritis and colitis, unspecified: Secondary | ICD-10-CM

## 2019-05-29 HISTORY — DX: Systemic lupus erythematosus, unspecified: M32.9

## 2019-05-29 HISTORY — DX: Acute embolism and thrombosis of unspecified deep veins of unspecified lower extremity: I82.409

## 2019-05-29 HISTORY — DX: Saddle embolus of pulmonary artery without acute cor pulmonale: I26.92

## 2019-05-29 HISTORY — DX: Reserved for concepts with insufficient information to code with codable children: IMO0002

## 2019-05-29 LAB — COMPREHENSIVE METABOLIC PANEL
ALT: 16 U/L (ref 0–44)
AST: 18 U/L (ref 15–41)
Albumin: 3.7 g/dL (ref 3.5–5.0)
Alkaline Phosphatase: 70 U/L (ref 38–126)
Anion gap: 6 (ref 5–15)
BUN: 12 mg/dL (ref 6–20)
CO2: 24 mmol/L (ref 22–32)
Calcium: 8.7 mg/dL — ABNORMAL LOW (ref 8.9–10.3)
Chloride: 108 mmol/L (ref 98–111)
Creatinine, Ser: 1.05 mg/dL — ABNORMAL HIGH (ref 0.44–1.00)
GFR calc Af Amer: >60 mL/min (ref >60)
GFR calc non Af Amer: >60 mL/min (ref >60)
Glucose, Bld: 95 mg/dL (ref 70–99)
Potassium: 3.8 mmol/L (ref 3.5–5.1)
Sodium: 138 mmol/L (ref 135–145)
Total Bilirubin: 0.3 mg/dL (ref 0.3–1.2)
Total Protein: 7.1 g/dL (ref 6.5–8.1)

## 2019-05-29 LAB — CBC WITH DIFFERENTIAL/PLATELET
Abs Immature Granulocytes: 0.02 10*3/uL (ref 0.00–0.07)
Basophils Absolute: 0 10*3/uL (ref 0.0–0.1)
Basophils Relative: 1 %
Eosinophils Absolute: 0.1 10*3/uL (ref 0.0–0.5)
Eosinophils Relative: 1 %
HCT: 34.4 % — ABNORMAL LOW (ref 36.0–46.0)
Hemoglobin: 10.4 g/dL — ABNORMAL LOW (ref 12.0–15.0)
Immature Granulocytes: 0 %
Lymphocytes Relative: 14 %
Lymphs Abs: 1.1 10*3/uL (ref 0.7–4.0)
MCH: 24.3 pg — ABNORMAL LOW (ref 26.0–34.0)
MCHC: 30.2 g/dL (ref 30.0–36.0)
MCV: 80.4 fL (ref 80.0–100.0)
Monocytes Absolute: 0.5 10*3/uL (ref 0.1–1.0)
Monocytes Relative: 7 %
Neutro Abs: 6.1 10*3/uL (ref 1.7–7.7)
Neutrophils Relative %: 77 %
Platelets: 249 10*3/uL (ref 150–400)
RBC: 4.28 MIL/uL (ref 3.87–5.11)
RDW: 15.1 % (ref 11.5–15.5)
WBC: 7.9 10*3/uL (ref 4.0–10.5)
nRBC: 0 % (ref 0.0–0.2)

## 2019-05-29 LAB — LIPASE, BLOOD: Lipase: 39 U/L (ref 11–51)

## 2019-05-29 LAB — SARS CORONAVIRUS 2 BY RT PCR (HOSPITAL ORDER, PERFORMED IN ~~LOC~~ HOSPITAL LAB): SARS Coronavirus 2: NEGATIVE

## 2019-05-29 LAB — HCG, QUANTITATIVE, PREGNANCY: hCG, Beta Chain, Quant, S: <1 m[IU]/mL (ref <5)

## 2019-05-29 MED ORDER — SODIUM CHLORIDE 0.9 % IV BOLUS
500.0000 mL | Freq: Once | INTRAVENOUS | Status: AC
  Administered 2019-05-29: 500 mL via INTRAVENOUS

## 2019-05-29 MED ORDER — IOHEXOL 300 MG/ML  SOLN
100.0000 mL | Freq: Once | INTRAMUSCULAR | Status: AC | PRN
  Administered 2019-05-29: 23:00:00 100 mL via INTRAVENOUS

## 2019-05-29 MED ORDER — DICYCLOMINE HCL 10 MG PO CAPS
20.0000 mg | ORAL_CAPSULE | Freq: Once | ORAL | Status: AC
  Administered 2019-05-29: 22:00:00 20 mg via ORAL
  Filled 2019-05-29: qty 2

## 2019-05-29 MED ORDER — SODIUM CHLORIDE 0.9 % IV BOLUS
1000.0000 mL | Freq: Once | INTRAVENOUS | Status: AC
  Administered 2019-05-29: 1000 mL via INTRAVENOUS

## 2019-05-29 MED ORDER — ACETAMINOPHEN 500 MG PO TABS
1000.0000 mg | ORAL_TABLET | Freq: Once | ORAL | Status: AC
  Administered 2019-05-29: 1000 mg via ORAL
  Filled 2019-05-29: qty 2

## 2019-05-29 NOTE — ED Notes (Signed)
Pt states she cannot urinate.   

## 2019-05-29 NOTE — ED Provider Notes (Addendum)
[facility] EMERGENCY DEPARTMENT Provider Note   CSN: [id number] Arrival date & time: 05/29/19  1916     History   Chief Complaint Chief Complaint  Patient presents with   Diarrhea    HPI Katrina Osborne is a 38 y.o. 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 Patient states for the past week or so she has had nasal congestion, dry cough & intermittent mild dyspnea which she attributed to post nasal drip. Today she started to feel generally poorly with generalized body aches, chills, fever w/ temp max 100.9, nausea, diarrhea, & abdominal pain. She estimates 10 episodes of non bloody diarrhea. Abdominal discomfort is intermittent, primarily prior to BMs. Took zofran w/ some improvement of nausea, no vomiting. Otherwise no specific alleviating/aggravating factors. Has had somewhat poor PO intake and feels generally weak w/ some lightheadedness. Denies ear pain, sore throat, chest pain, vomiting, melena, or hematemesis. No known COVID 19 exposures. Works in Teacher, music. Denies foreign travel or recent abx use.      HPI  Past Medical History:  Diagnosis Date   DVT (deep venous thrombosis) (HCC)    Hashimoto's disease    Hashimoto's disease 09/22/2015   History of herniated intervertebral disc    Kidney stone    Lupus (HCC)    Ovarian torsion    Saddle embolism of pulmonary artery (HCC)    Thyroid disease     There are no active problems to display for this patient.   Past Surgical History:  Procedure Laterality Date   BACK SURGERY     CHOLECYSTECTOMY     NECK SURGERY     c4-c7   TONSILLECTOMY       OB History    Gravida  1   Para      Term      Preterm      AB      Living        SAB      TAB      Ectopic      Multiple      Live Births               Home Medications    Prior to  Admission medications   Medication Sig Start Date End Date Taking? Authorizing Provider  clindamycin (CLEOCIN) 300 MG capsule Take 1 capsule (300 mg total) by mouth 4 (four) times daily. 09/15/17   Triplett, Tammy, PA-C  cyclobenzaprine (FLEXERIL) 10 MG tablet Take 1 tablet (10 mg total) by mouth 3 (three) times daily as needed. 09/15/17   Triplett, Tammy, PA-C  HYDROcodone-acetaminophen (NORCO/VICODIN) 5-325 MG tablet Take 1 tablet by mouth every 6 (six) hours as needed for moderate pain.    [provider]  ibuprofen (ADVIL,MOTRIN) 200 MG tablet Take 800 mg by mouth every 6 (six) hours as needed.    [provider]  levothyroxine (SYNTHROID, LEVOTHROID) 25 MCG tablet Take 25 mcg by mouth daily before breakfast.    [provider]  pantoprazole (PROTONIX) 40 MG tablet Take 40 mg by mouth daily.    [provider]  sertraline (ZOLOFT) 50 MG tablet Take 50 mg by mouth at bedtime.    [provider]    Family History History reviewed. No pertinent family history.  Social History Social History   Tobacco Use   Smoking status: Former Smoker  Quit date: 09/15/2012    Years since quitting: 6.7   Smokeless tobacco: Never Used  Substance Use Topics   Alcohol use: No   Drug use: Not on file     Allergies   Morphine and related and Penicillins   Review of Systems Review of Systems  Constitutional: Positive for chills, fatigue and fever.  HENT: Positive for congestion. Negative for ear pain and sore throat.   Respiratory: Positive for cough and shortness of breath.   Cardiovascular: Negative for chest pain.  Gastrointestinal: Positive for abdominal pain, diarrhea and nausea. Negative for anal bleeding, blood in stool, constipation and vomiting.  Genitourinary: Negative for dysuria, vaginal bleeding and vaginal discharge.  Musculoskeletal: Positive for myalgias.  Neurological: Positive for weakness (generalized) and light-headedness.  Negative for syncope and speech difficulty.  All other systems reviewed and are negative.    Physical Exam Updated Vital Signs BP 118/74 (BP Location: Right Arm)    Pulse 74    Temp 98.9 F (37.2 C) (Oral)    Resp 16    Ht 5\' 6"  (1.676 m)    Wt 117.9 kg    LMP 05/15/2019    SpO2 100%    BMI 41.97 kg/m   Physical Exam Vitals signs and nursing note reviewed.  Constitutional:      General: She is not in acute distress.    Appearance: She is well-developed.  HENT:     Head: Normocephalic and atraumatic.     Right Ear: Ear canal normal. Tympanic membrane is not perforated, erythematous, retracted or bulging.     Left Ear: Ear canal normal. Tympanic membrane is not perforated, erythematous, retracted or bulging.     Ears:     Comments: No mastoid erythema/swelling/tenderness.     Nose:     Right Sinus: No maxillary sinus tenderness or frontal sinus tenderness.     Left Sinus: No maxillary sinus tenderness or frontal sinus tenderness.     Mouth/Throat:     Mouth: Mucous membranes are dry.     Pharynx: Uvula midline. No oropharyngeal exudate or posterior oropharyngeal erythema.     Comments: Posterior oropharynx is symmetric appearing. Patient tolerating own secretions without difficulty. No trismus. No drooling. No hot potato voice. No swelling beneath the tongue, submandibular compartment is soft.  Eyes:     General:        Right eye: No discharge.        Left eye: No discharge.     Conjunctiva/sclera: Conjunctivae normal.     Pupils: Pupils are equal, round, and reactive to light.  Neck:     Musculoskeletal: Normal range of motion and neck supple. No edema or neck rigidity.  Cardiovascular:     Rate and Rhythm: Normal rate and regular rhythm.     Heart sounds: No murmur.  Pulmonary:     Effort: Pulmonary effort is normal. No respiratory distress.     Breath sounds: Normal breath sounds. No wheezing, rhonchi or rales.  Abdominal:     General: There is no distension.      Palpations: Abdomen is soft.     Tenderness: There is abdominal tenderness (bilateral lower abdomen). There is no guarding or rebound.  Lymphadenopathy:     Cervical: No cervical adenopathy.  Skin:    General: Skin is warm and dry.     Findings: No rash.  Neurological:     General: No focal deficit present.     Mental Status: She is alert.  Psychiatric:  Behavior: Behavior normal.    ED Treatments / Results  Labs (all labs ordered are listed, but only abnormal results are displayed) Labs Reviewed  COMPREHENSIVE METABOLIC PANEL - Abnormal; Notable for the following components:      Result Value   Creatinine, Ser 1.05 (*)    Calcium 8.7 (*)    All other components within normal limits  CBC WITH DIFFERENTIAL/PLATELET - Abnormal; Notable for the following components:   Hemoglobin 10.4 (*)    HCT 34.4 (*)    MCH 24.3 (*)    All other components within normal limits  SARS CORONAVIRUS 2 (HOSPITAL ORDER, PERFORMED IN [facility])  LIPASE, BLOOD  URINALYSIS, ROUTINE W REFLEX MICROSCOPIC  POC URINE PREG, ED    EKG None  Radiology Dg Chest Portable 1 View  Result Date: 05/29/2019 CLINICAL DATA:  Generalized body aches, chills, diarrhea, cough, fever 100.9, history lupus, former smoker EXAM: PORTABLE CHEST 1 VIEW COMPARISON:  Portable exam 2009 hours compared to 04/17/2019 FINDINGS: Normal heart size, mediastinal contours, and pulmonary vascularity. Lungs clear. No infiltrate, pleural effusion or pneumothorax. Prior cervical spine fusion. IMPRESSION: No acute abnormalities. Electronically Signed   By: Ulyses SouthwardMark  Boles M.D.   On: 05/29/2019 20:22    Procedures Procedures (including critical care time)  Medications Ordered in ED Medications - No data to display   Initial Impression / Assessment and Plan / ED Course  I have reviewed the triage vital signs and the nursing notes.  Pertinent labs & imaging results that were available during my care of the patient were  reviewed by me and considered in my medical decision making (see chart for details).   Patient presents to the ED w/ complaints of malaise, body aches, fever/chills, abdominal pain & diarrhea which began today. Has had some mild nasal congestion/cough/dyspnea x 1 week. Nontoxic appearing, resting comfortably, vitals WNL. Exam w/ dry mucous membranes & lower abdominal tenderness w/o peritoneal signs. Plan for labs, CXR, & CT A/P. Fluids & tylenol ordered.    CBC: No leukocytosis/leukopenia. Anemia which patient reports she has a hx of- taking iron for this.  CMP: Mildly elevated creatinine- receiving fluids in the ED- PCP recheck. Mild hypocalcemia no significant electrolyte derangement. LFTs WNL Lipase: WNL CXR: No acute abnormalities  22:00: Patient care signed out to Burgess AmorJulie Idol PA-C at change of shift pending remaining labs, CT imaging, re-eval & disposition. If no significant abnormalities anticipate discharge home with supportive care.   Royston SinnerCynthia Coachman was evaluated in Emergency Department on 05/29/2019 for the symptoms described in the history of present illness. He/she was evaluated in the context of the global COVID-19 pandemic, which necessitated consideration that the patient might be at risk for infection with the SARS-CoV-2 virus that causes COVID-19. Institutional protocols and algorithms that pertain to the evaluation of patients at risk for COVID-19 are in a state of rapid change based on information released by regulatory bodies including the CDC and federal and state organizations. These policies and algorithms were followed during the patient's care in the ED.   Final Clinical Impressions(s) / ED Diagnoses   Final diagnoses:  None    ED Discharge Orders    None       Cherly Andersonetrucelli, Fouad Taul R, PA-C 05/29/19 2201    Cherly Andersonetrucelli, Yanai Hobson R, PA-C 05/30/19 0001    Raeford RazorKohut, Stephen, MD 05/31/19 1901

## 2019-05-29 NOTE — ED Triage Notes (Signed)
Pt c/o generalized body aches with chills and diarrhea all day; temp was 100.9

## 2019-05-29 NOTE — ED Provider Notes (Signed)
Pt signed out to me at shift change from [facility], pending Ct imaging. Positive for mild colitis.  Pt had no diarrhea while here, advised to not take any additional anti diarrheals (took imodium prior to arrival) unless diarrhea becomes more frequent. Clear liquids x 24 hours, then increasing if sx tolerate.  Discussed cipro which was started. Pt states she has not tolerated flagyl in the past so deferred this med.  She has plenty of zofran so not prescribed.      Evalee [facility], PA-C 05/30/19 Marcellus Scott, MD 05/31/19 1901

## 2019-05-30 MED ORDER — CIPROFLOXACIN HCL 250 MG PO TABS
500.0000 mg | ORAL_TABLET | Freq: Once | ORAL | Status: AC
  Administered 2019-05-30: 01:00:00 500 mg via ORAL
  Filled 2019-05-30: qty 2

## 2019-05-30 MED ORDER — CIPROFLOXACIN HCL 500 MG PO TABS: 500.0000 mg | ORAL_TABLET | Freq: Two times a day (BID) | ORAL | 0 refills | Status: AC

## 2019-05-30 NOTE — Discharge Instructions (Signed)
Take the entire course of the antibiotic prescribed.  Rest and make sure you are drinking plenty of fluids.  Maintain a clear liquid diet for the next day, then slowly advance your diet if your symptoms are improving.  Call your primary MD for a recheck if your symptoms persist beyond the next 3-4 days or you develop fever, worsening pain, return of your diarrhea or worsening weakness.

## 2019-06-19 ENCOUNTER — Other Ambulatory Visit: Payer: Self-pay

## 2019-06-19 DIAGNOSIS — I83899 Varicose veins of unspecified lower extremities with other complications: Secondary | ICD-10-CM

## 2019-06-20 ENCOUNTER — Ambulatory Visit (HOSPITAL_COMMUNITY)
Admission: RE | Admit: 2019-06-20 | Discharge: 2019-06-20 | Disposition: A | Discharge status: Home / Self Care | Payer: Commercial Managed Care - PPO | Source: Ambulatory Visit | Attending: Vascular Surgery | Admitting: Vascular Surgery

## 2019-06-20 ENCOUNTER — Other Ambulatory Visit: Payer: Self-pay | Admitting: Vascular Surgery

## 2019-06-20 ENCOUNTER — Ambulatory Visit (INDEPENDENT_AMBULATORY_CARE_PROVIDER_SITE_OTHER): Payer: Commercial Managed Care - PPO | Admitting: Vascular Surgery

## 2019-06-20 ENCOUNTER — Encounter: Payer: Self-pay | Admitting: Vascular Surgery

## 2019-06-20 ENCOUNTER — Other Ambulatory Visit: Payer: Self-pay

## 2019-06-20 DIAGNOSIS — I82432 Acute embolism and thrombosis of left popliteal vein: Secondary | ICD-10-CM | POA: Diagnosis not present

## 2019-06-20 DIAGNOSIS — I83899 Varicose veins of unspecified lower extremities with other complications: Secondary | ICD-10-CM

## 2019-06-20 DIAGNOSIS — I82409 Acute embolism and thrombosis of unspecified deep veins of unspecified lower extremity: Secondary | ICD-10-CM | POA: Insufficient documentation

## 2019-06-20 NOTE — Progress Notes (Signed)
Patient name: Katrina Osborne MRN: [id number] DOB: 1980-12-19 Sex: female  REASON FOR CONSULT: Left leg edema and rule out iliac vein compression  HPI: Katrina Osborne is a 38 y.o. female, with history of lupus, Hashimoto's, and left lower extremity DVT with saddle embolus in January of this year following a anterior cervical fusion that presents for evaluation of left leg swelling as well as rule out iliac vein compression.  Patient states she had anterior cervical fusion in January with Dr. Yetta Barre.  Several weeks later she noted she was having increased shortness of breath and was developing swelling in her left leg.  Ultimately she presented to the ER here that showed acute saddle pulmonary embolus with a large bilateral pulmonary emboli.  She denies any previous blood clots prior to this event.  She had a venous duplex that showed left popliteal vein acute thrombus.  She was subsequently placed on Eliquis.  She has been seen by medical oncology in [facility].  She has had a work-up and showed a factor V Leiden mutation was not detected, a prothrombin gene mutation was not detected, lupus anticoagulant was not detected.  Her IgM was slightly elevated her ANA was positive.  Patient states she has also been referred to rheumatology and her hematologist wants to repeat her labs once her lupus is better controlled.  She does not wear compression.  She does feel like her left leg is achy and heavy at the end of the day.  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  Past Medical History:  Diagnosis Date   DVT (deep venous thrombosis) (HCC)    Hashimoto's disease    Hashimoto's disease 09/22/2015   History of herniated intervertebral disc    Kidney stone    Lupus (HCC)    Ovarian torsion    Saddle embolism of pulmonary artery (HCC)    Thyroid disease     Past Surgical History:  Procedure Laterality Date   BACK SURGERY     CHOLECYSTECTOMY     NECK SURGERY     c4-c7    TONSILLECTOMY      Family History  Problem Relation Age of Onset   Thyroid disease Mother    Pulmonary embolism Father     SOCIAL HISTORY: Social History   Socioeconomic History   Marital status: Married    Spouse name: Not on file   Number of children: Not on file   Years of education: Not on file   Highest education level: Not on file  Occupational History   Not on file  Social Needs   Financial resource strain: Not on file   Food insecurity    Worry: Not on file    Inability: Not on file   Transportation needs    Medical: Not on file    Non-medical: Not on file  Tobacco Use   Smoking status: Former Smoker    Quit date: 09/15/2012    Years since quitting: 6.7   Smokeless tobacco: Never Used  Substance and Sexual Activity   Alcohol use: No   Drug use: Not on file   Sexual activity: Yes    Birth control/protection: None  Lifestyle   Physical activity    Days per week: Not on file    Minutes per session: Not on file   Stress: Not on file  Relationships   Social connections    Talks on phone: Not on file    Gets together: Not on file    Attends  religious service: Not on file    Active member of club or organization: Not on file    Attends meetings of clubs or organizations: Not on file    Relationship status: Not on file   Intimate partner violence    Fear of current or ex partner: Not on file    Emotionally abused: Not on file    Physically abused: Not on file    Forced sexual activity: Not on file  Other Topics Concern   Not on file  Social History Narrative   Not on file    Allergies  Allergen Reactions   Morphine And Related Hives   Penicillins Nausea And Vomiting    Has patient had a PCN reaction causing immediate rash, facial/tongue/throat swelling, SOB or lightheadedness with hypotension: No Has patient had a PCN reaction causing severe rash involving mucus membranes or skin necrosis: No Has patient had a PCN reaction  that required hospitalization: No Has patient had a PCN reaction occurring within the last 10 years: No If all of the above answers are "NO", then may proceed with Cephalosporin use.     Current Outpatient Medications  Medication Sig Dispense Refill   albuterol (VENTOLIN HFA) 108 (90 Base) MCG/ACT inhaler Inhale 1-2 puffs into the lungs every 6 (six) hours as needed for wheezing or shortness of breath.      ALPRAZolam (XANAX) 0.5 MG tablet Take 0.5 mg by mouth at bedtime.      apixaban (ELIQUIS) 5 MG TABS tablet Take 5 mg by mouth 2 (two) times daily.     clobetasol cream (TEMOVATE) 8.58 % Apply 1 application topically 2 (two) times daily.      Ferrous Sulfate (SLOW FE) 142 (45 Fe) MG TBCR Take 1 tablet by mouth daily.     fluticasone (FLONASE) 50 MCG/ACT nasal spray Place 2 sprays into the nose daily as needed for allergies.      HYDROcodone-acetaminophen (NORCO/VICODIN) 5-325 MG tablet Take 1 tablet by mouth every 6 (six) hours as needed for moderate pain.     ipratropium (ATROVENT) 0.06 % nasal spray Place 2 sprays into both nostrils daily as needed for rhinitis.      levothyroxine (SYNTHROID, LEVOTHROID) 25 MCG tablet Take 25 mcg by mouth daily before breakfast.     naloxone (NARCAN) nasal spray 4 mg/0.1 mL Administer a single spray intranasally into one nostril.  Call 911.  May repeat x1 in 2 to 3 minutes using a new nasal spray.     omeprazole (PRILOSEC) 40 MG capsule Take 40 mg by mouth every morning.      ondansetron (ZOFRAN) 4 MG tablet Take 4 mg by mouth every 8 (eight) hours as needed for nausea or vomiting.      oxyCODONE-acetaminophen (PERCOCET/ROXICET) 5-325 MG tablet Take 1 tablet by mouth 2 (two) times daily as needed for severe pain.     PLAQUENIL 200 MG tablet Take 200 mg by mouth 2 (two) times daily with a meal.     predniSONE (DELTASONE) 5 MG tablet Take 5 mg by mouth See admin instructions. To take as directed/as needed for flare ups     No current  facility-administered medications for this visit.     REVIEW OF SYSTEMS:  [X]  denotes positive finding, [ ]  denotes negative finding Cardiac  Comments:  Chest pain or chest pressure:    Shortness of breath upon exertion:    Short of breath when lying flat:    Irregular heart rhythm:  Vascular    Pain in calf, thigh, or hip brought on by ambulation:    Pain in feet at night that wakes you up from your sleep:     Blood clot in your veins:    Leg swelling:  x Left      Pulmonary    Oxygen at home:    Productive cough:     Wheezing:         Neurologic    Sudden weakness in arms or legs:     Sudden numbness in arms or legs:     Sudden onset of difficulty speaking or slurred speech:    Temporary loss of vision in one eye:     Problems with dizziness:         Gastrointestinal    Blood in stool:     Vomited blood:         Genitourinary    Burning when urinating:     Blood in urine:        Psychiatric    Major depression:         Hematologic    Bleeding problems:    Problems with blood clotting too easily:        Skin    Rashes or ulcers:        Constitutional    Fever or chills:      PHYSICAL EXAM: Vitals:   06/20/19 0942  BP: 104/74  Pulse: (!) 57  Resp: 16  Temp: 98.4 F (36.9 C)  TempSrc: Temporal  SpO2: 100%  Weight: 252 lb (114.3 kg)  Height: 5\' 6"  (1.676 m)    GENERAL: The patient is a well-nourished female, in no acute distress. The vital signs are documented above. CARDIAC: There is a regular rate and rhythm.  VASCULAR:  Radial pulses 2+ bilaterally Femoral pulses 2+ bilaterally Dorsalis pedis pulses 2+ bilaterally Pretibial 1+ edema in left lower extremity.  She does have spider veins on the back of her left calf with no other obvious varicosities. PULMONARY: There is good air exchange bilaterally without wheezing or rales. ABDOMEN: Soft and non-tender with normal pitched bowel sounds.  MUSCULOSKELETAL: There are no major deformities or  cyanosis. NEUROLOGIC: No focal weakness or paresthesias are detected. SKIN: There are no ulcers or rashes noted. PSYCHIATRIC: The patient has a normal affect.  DATA:   Independently reviewed her left leg venous reflux study.  There is no evidence of DVT.  There is no evidence of left iliac vein compression.  She has evidence of abnormal reflux times in the left common femoral vein, femoral vein and popliteal vein.  Also abnormal reflux times in the great saphenous vein at the saphenofemoral junction and a anterior accessory saphenous vein.  Assessment/Plan:  38 year old female with history of lupus and Hashimoto's that presents for evaluation of left leg swelling after presenting with left popliteal vein DVT and bilateral saddle PEs in January of this year several weeks after anterior cervical disc fusion.  On duplex today she has no evidence of iliac vein compression and no evidence of DVT in the left lower extremity.  I do not think she has May Thurner. She does have fair amount of venous insufficiency with abnormal reflux times mostly in the deep system including the left common femoral, femoral, and popliteal veins.  Otherwise has some very focal reflux in the superficial system that is not amenable to intervention at this time. Discussed that she should be wearing thigh-high compression with 20-30 mm Hg compression  to treat her venous insufficiency and leg swelling. I will defer to medical oncology who is currently coordinating hypercoagulable work-up to determine how long she needs to stay on her Eliquis.  Some of her labs were abnormal as noted above but it sounds like according to the patient these are going to be repeated once her lupus is better controlled. Offered her PRN follow-up.   Cephus Shelling, MD [facility] Office: [phone] Pager: [phone]   Cephus Shelling

## 2019-07-05 ENCOUNTER — Other Ambulatory Visit: Payer: Self-pay | Admitting: *Deleted

## 2019-07-05 DIAGNOSIS — Z20822 Contact with and (suspected) exposure to covid-19: Secondary | ICD-10-CM

## 2019-07-08 LAB — NOVEL CORONAVIRUS, NAA: SARS-CoV-2, NAA: DETECTED — AB

## 2019-11-27 ENCOUNTER — Encounter (HOSPITAL_COMMUNITY): Payer: Self-pay | Admitting: Radiology

## 2019-11-27 ENCOUNTER — Emergency Department (HOSPITAL_COMMUNITY)
Admission: EM | Admit: 2019-11-27 | Discharge: 2019-11-27 | Disposition: A | Discharge status: Home / Self Care | Payer: Commercial Managed Care - PPO | Attending: Emergency Medicine | Admitting: Emergency Medicine

## 2019-11-27 ENCOUNTER — Other Ambulatory Visit: Payer: Self-pay

## 2019-11-27 ENCOUNTER — Emergency Department (HOSPITAL_COMMUNITY): Payer: Commercial Managed Care - PPO

## 2019-11-27 DIAGNOSIS — Z87891 Personal history of nicotine dependence: Secondary | ICD-10-CM | POA: Insufficient documentation

## 2019-11-27 DIAGNOSIS — R0781 Pleurodynia: Secondary | ICD-10-CM | POA: Diagnosis not present

## 2019-11-27 DIAGNOSIS — Z79899 Other long term (current) drug therapy: Secondary | ICD-10-CM | POA: Insufficient documentation

## 2019-11-27 DIAGNOSIS — Z7901 Long term (current) use of anticoagulants: Secondary | ICD-10-CM | POA: Diagnosis not present

## 2019-11-27 DIAGNOSIS — R0602 Shortness of breath: Secondary | ICD-10-CM | POA: Diagnosis present

## 2019-11-27 LAB — CBC
HCT: 40.3 % (ref 36.0–46.0)
Hemoglobin: 12.7 g/dL (ref 12.0–15.0)
MCH: 28 pg (ref 26.0–34.0)
MCHC: 31.5 g/dL (ref 30.0–36.0)
MCV: 88.8 fL (ref 80.0–100.0)
Platelets: 189 10*3/uL (ref 150–400)
RBC: 4.54 MIL/uL (ref 3.87–5.11)
RDW: 13.1 % (ref 11.5–15.5)
WBC: 6.7 10*3/uL (ref 4.0–10.5)
nRBC: 0 % (ref 0.0–0.2)

## 2019-11-27 LAB — BASIC METABOLIC PANEL
Anion gap: 7 (ref 5–15)
BUN: 12 mg/dL (ref 6–20)
CO2: 29 mmol/L (ref 22–32)
Calcium: 8.4 mg/dL — ABNORMAL LOW (ref 8.9–10.3)
Chloride: 104 mmol/L (ref 98–111)
Creatinine, Ser: 1.1 mg/dL — ABNORMAL HIGH (ref 0.44–1.00)
GFR calc Af Amer: >60 mL/min (ref >60)
GFR calc non Af Amer: >60 mL/min (ref >60)
Glucose, Bld: 96 mg/dL (ref 70–99)
Potassium: 3.6 mmol/L (ref 3.5–5.1)
Sodium: 140 mmol/L (ref 135–145)

## 2019-11-27 LAB — TROPONIN I (HIGH SENSITIVITY): Troponin I (High Sensitivity): <2 ng/L (ref <18)

## 2019-11-27 MED ORDER — IOHEXOL 350 MG/ML SOLN
100.0000 mL | Freq: Once | INTRAVENOUS | Status: AC | PRN
  Administered 2019-11-27: 100 mL via INTRAVENOUS

## 2019-11-27 NOTE — ED Notes (Signed)
Pt did not void.

## 2019-11-27 NOTE — ED Triage Notes (Signed)
Patient states chest pain on the left side of the chest with shortness of breath. Patient states that she has been having symptoms x 3 days. She does have a history of a saddle PE in Jan 2020. Patient is on Eliquis. Patient has also had a recent Lupus flare up.

## 2019-11-27 NOTE — ED Provider Notes (Signed)
[facility] EMERGENCY DEPARTMENT Provider Note   CSN: [id number] Arrival date & time: 11/27/19  9518     History Chief Complaint  Patient presents with  . Shortness of Breath    chest pain    Katrina Osborne is a 39 y.o. female.  HPI  HPI: A 39 year old patient presents for evaluation of chest pain. Initial onset of pain was more than 6 hours ago. The patient's chest pain is sharp and is worse with exertion. The patient's chest pain is middle- or left-sided, is not well-localized, is not described as heaviness/pressure/tightness and does not radiate to the arms/jaw/neck. The patient does not complain of nausea and denies diaphoresis. The patient has no history of stroke, has no history of peripheral artery disease, has not smoked in the past 90 days, denies any history of treated diabetes, has no relevant family history of coronary artery disease (first degree relative at less than age 62), is not hypertensive, has no history of hypercholesterolemia and does not have an elevated BMI (>=30). Patient presents to the ED with complaints of chest pain.  Patient states she started having symptoms a few days ago.  The symptoms were preceded by a lupus flare and she was started on steroids.  She has had a discomfort in the center of her chest moving around towards the sides.  It has been increasing in severity.  Pain increases when she takes a deep breath.  She is feeling short of breath.  Patient has a history of a saddle embolus and is concerned that she is having another pulmonary embolism.  She is on Eliquis and has been compliant  Past Medical History:  Diagnosis Date  . DVT (deep venous thrombosis) (HCC)   . Hashimoto's disease   . Hashimoto's disease 09/22/2015  . History of herniated intervertebral disc   . Kidney stone   . Lupus (HCC)   . Ovarian torsion   . Saddle embolism of pulmonary artery (HCC)   . Thyroid disease     Patient Active Problem List   Diagnosis Date Noted  . DVT  (deep venous thrombosis) (HCC) 06/20/2019    Past Surgical History:  Procedure Laterality Date  . BACK SURGERY    . CHOLECYSTECTOMY    . NECK SURGERY     c4-c7  . TONSILLECTOMY       OB History    Gravida  1   Para      Term      Preterm      AB      Living        SAB      TAB      Ectopic      Multiple      Live Births              Family History  Problem Relation Age of Onset  . Thyroid disease Mother   . Pulmonary embolism Father     Social History   Tobacco Use  . Smoking status: Former Smoker    Quit date: 09/15/2012    Years since quitting: 7.2  . Smokeless tobacco: Never Used  Substance Use Topics  . Alcohol use: No  . Drug use: Not on file    Home Medications Prior to Admission medications   Medication Sig Start Date End Date Taking? Authorizing Provider  albuterol (VENTOLIN HFA) 108 (90 Base) MCG/ACT inhaler Inhale 1-2 puffs into the lungs every 6 (six) hours as needed for wheezing or shortness of breath.  Yes [provider]  ALPRAZolam Duanne Moron) 0.5 MG tablet Take 0.5 mg by mouth at bedtime.  03/28/19  Yes [provider]  apixaban (ELIQUIS) 5 MG TABS tablet Take 5 mg by mouth 2 (two) times daily.   Yes [provider]  Ascorbic Acid (VITAMIN C GUMMIE PO) Take 1 tablet by mouth daily.   Yes [provider]  clobetasol cream (TEMOVATE) 3.87 % Apply 1 application topically 2 (two) times daily as needed.  04/09/19  Yes [provider]  cyclobenzaprine (FLEXERIL) 5 MG tablet Take 5 mg by mouth daily as needed for muscle spasms.   Yes [provider]  escitalopram (LEXAPRO) 5 MG tablet Take 5 mg by mouth daily. 10/28/19  Yes [provider]  ferrous gluconate (FERGON) 324 MG tablet Take 324 mg by mouth daily with breakfast.   Yes [provider]  fluticasone (FLONASE) 50 MCG/ACT nasal spray Place 2 sprays into the nose daily as needed for allergies.    Yes [provider]  HYDROcodone-acetaminophen (NORCO/VICODIN) 5-325 MG tablet Take 1 tablet by mouth every 6 (six) hours as needed for moderate pain.   Yes [provider]  levothyroxine (SYNTHROID) 50 MCG tablet Take 50 mcg by mouth every morning. 09/07/19  Yes [provider]  naloxone (NARCAN) nasal spray 4 mg/0.1 mL Administer a single spray intranasally into one nostril.  Call 911.  May repeat x1 in 2 to 3 minutes using a new nasal spray. 02/23/17  Yes [provider]  omeprazole (PRILOSEC) 40 MG capsule Take 40 mg by mouth every morning.  01/27/19  Yes [provider]  ondansetron (ZOFRAN) 4 MG tablet Take 4 mg by mouth every 8 (eight) hours as needed for nausea or vomiting.  02/22/19  Yes [provider]  oxyCODONE-acetaminophen (PERCOCET/ROXICET) 5-325 MG tablet Take 1 tablet by mouth 2 (two) times daily as needed for severe pain.   Yes [provider]  PLAQUENIL 200 MG tablet Take 200 mg by mouth 2 (two) times daily with a meal. 02/22/19  Yes [provider]  predniSONE (DELTASONE) 5 MG tablet Take 5 mg by mouth See admin instructions. To take as directed/as needed for flare ups; 30mg  on first, 20mg  on second, 10mg  on third, and 5mg  on fourth 05/23/19  Yes [provider]    Allergies    Morphine and related and Penicillins  Review of Systems   Review of Systems  Constitutional: Negative for fever.  HENT: Negative for congestion.   Respiratory: Positive for shortness of breath. Negative for cough.   Cardiovascular: Positive for chest pain.  Gastrointestinal: Negative for abdominal pain.  Musculoskeletal:       No new leg swelling  All other systems reviewed and are negative.   Physical Exam Updated Vital Signs BP 134/84   Pulse (!) 47   Temp 98.2 F (36.8 C) (Oral)   Resp 14   Ht 1.676 m (5\' 6" )   Wt 117.9 kg   LMP 10/27/2019   SpO2 100%   BMI 41.97 kg/m   Physical Exam Vitals and nursing note reviewed.   Constitutional:      General: She is not in acute distress.    Appearance: She is well-developed.  HENT:     Head: Normocephalic and atraumatic.     Right Ear: External ear normal.     Left Ear: External ear normal.  Eyes:     General: No scleral icterus.       Right  eye: No discharge.        Left eye: No discharge.     Conjunctiva/sclera: Conjunctivae normal.  Neck:     Trachea: No tracheal deviation.  Cardiovascular:     Rate and Rhythm: Normal rate and regular rhythm.  Pulmonary:     Effort: Pulmonary effort is normal. No respiratory distress.     Breath sounds: Normal breath sounds. No stridor. No wheezing or rales.  Abdominal:     General: Bowel sounds are normal. There is no distension.     Palpations: Abdomen is soft.     Tenderness: There is no abdominal tenderness. There is no guarding or rebound.  Musculoskeletal:        General: No tenderness.     Cervical back: Neck supple.  Skin:    General: Skin is warm and dry.     Findings: No rash.  Neurological:     Mental Status: She is alert.     Cranial Nerves: No cranial nerve deficit (no facial droop, extraocular movements intact, no slurred speech).     Sensory: No sensory deficit.     Motor: No abnormal muscle tone or seizure activity.     Coordination: Coordination normal.     ED Results / Procedures / Treatments   Labs (all labs ordered are listed, but only abnormal results are displayed) Labs Reviewed  BASIC METABOLIC PANEL - Abnormal; Notable for the following components:      Result Value   Creatinine, Ser 1.10 (*)    Calcium 8.4 (*)    All other components within normal limits  CBC  TROPONIN I (HIGH SENSITIVITY)    EKG EKG Interpretation  Date/Time:  Monday November 27 2019 07:10:13 EST Ventricular Rate:  59 PR Interval:    QRS Duration: 96 QT Interval:  421 QTC Calculation: 417 R Axis:   49 Text Interpretation: Sinus rhythm Low voltage, precordial leads No old tracing to compare Confirmed by  Linwood Dibbles [phone]) on 11/27/2019 7:20:54 AM   Radiology CT Angio Chest PE W and/or Wo Contrast  Result Date: 11/27/2019 CLINICAL DATA:  Left anterior chest pain and shortness of breath for 3 days. Lupus. History of pulmonary emboli. EXAM: CT ANGIOGRAPHY CHEST WITH CONTRAST TECHNIQUE: Multidetector CT imaging of the chest was performed using the standard protocol during bolus administration of intravenous contrast. Multiplanar CT image reconstructions and MIPs were obtained to evaluate the vascular anatomy. CONTRAST:  OMNIPAQUE IOHEXOL 350 MG/ML SOLN COMPARISON:  None. FINDINGS: Cardiovascular: Negative for pulmonary embolus. Vascular structures are unremarkable. Heart is mildly enlarged. No pericardial effusion. Mediastinum/Nodes: No pathologically enlarged mediastinal, hilar or axillary lymph nodes. Esophagus is unremarkable. Lungs/Pleura: 4 mm peripheral right lower lobe nodule (6/69). Lungs are otherwise clear. No pleural fluid. Airway is unremarkable. Upper Abdomen: Visualized portions of the liver, adrenal glands, spleen, pancreas, stomach and bowel are grossly unremarkable. Cholecystectomy. Upper abdominal lymph nodes are not enlarged by CT size criteria. Musculoskeletal: No worrisome lytic or sclerotic lesions. Review of the MIP images confirms the above findings. IMPRESSION: 1. Negative for pulmonary embolus. 2. Mild cardiac enlargement. 3. 4 mm right lower lobe nodule. No follow-up needed if patient is low-risk. Non-contrast chest CT can be considered in 12 months if patient is high-risk. This recommendation follows the consensus statement: Guidelines for Management of Incidental Pulmonary Nodules Detected on CT Images: From the Fleischner Society 2017; Radiology 2017; 284:228-243. Electronically Signed   By: Leanna Battles M.D.   On: 11/27/2019 08:59   DG Chest Portable  1 View  Result Date: 11/27/2019 CLINICAL DATA:  Pt c/o generalized CP with SOB x 3 days. Hx Hashimoto's disease, former  smoker, saddle PE, COVID Oct 2020 EXAM: PORTABLE CHEST 1 VIEW COMPARISON:  05/29/2019 FINDINGS: Cardiac silhouette normal in size. No mediastinal or hilar masses or evidence of adenopathy. Clear lungs.  No pleural effusion or pneumothorax. Skeletal structures are grossly intact. IMPRESSION: No active disease. Electronically Signed   By: Amie Portland M.D.   On: 11/27/2019 07:41    Procedures Procedures (including critical care time)  Medications Ordered in ED Medications  iohexol (OMNIPAQUE) 350 MG/ML injection 100 mL (100 mLs Intravenous Contrast Given 11/27/19 0845)    ED Course  I have reviewed the triage vital signs and the nursing notes.  Pertinent labs & imaging results that were available during my care of the patient were reviewed by me and considered in my medical decision making (see chart for details).  Clinical Course as of Nov 26 1009  Mon Nov 27, 2019  9675 Labs reviewed.  No significant abnormalities.  Chest x-ray negative.  We will proceed with CT angio considering PE concern   [JK]  0939 CT scan does not show any evidence of pulmonary embolism   [JK]    Clinical Course User Index [JK] Linwood Dibbles, MD   MDM Rules/Calculators/A&P HEAR Score: 0                    Patient symptoms are atypical for acute coronary syndrome.  Troponin and EKG are reassuring.  Patient does have pleuritic chest pain and has history of prior PE so CT angiogram was performed.  No evidence of recurrent pulmonary embolism.  Symptoms most likely related to pleurisy.  Incidental nodule finding discussed. Pt used to smoke for 17 years. At this time there does not appear to be any evidence of an acute emergency medical condition and the patient appears stable for discharge with appropriate outpatient follow up.  Final Clinical Impression(s) / ED Diagnoses Final diagnoses:  Pleurodynia    Rx / DC Orders ED Discharge Orders    None       Linwood Dibbles, MD 11/27/19 1012

## 2019-11-27 NOTE — Discharge Instructions (Signed)
Take over-the-counter medications as needed for pain.  The symptoms are most likely related to pleurisy.  The CT scan did show an incidental lung nodule on the right lower lung.  Discuss follow up imaging with your primary care doctor.  " 4 mm right lower lobe nodule. No follow-up needed if patient is low-risk. Non-contrast chest CT can be considered in 12 months if patient is high-risk. This recommendation follows the consensus statement: Guidelines for Management of Incidental Pulmonary Nodules Detected on CT Images: From the Fleischner Society 2017; Radiology 2017; 284:228-243."

## 2020-04-29 IMAGING — CR DG CHEST 2V
2 series · 2 of 2 positions shown · non-contrast
Comparison: None.

CLINICAL DATA: Dyspnea

EXAM:
CHEST - 2 VIEW

[w chest pa]
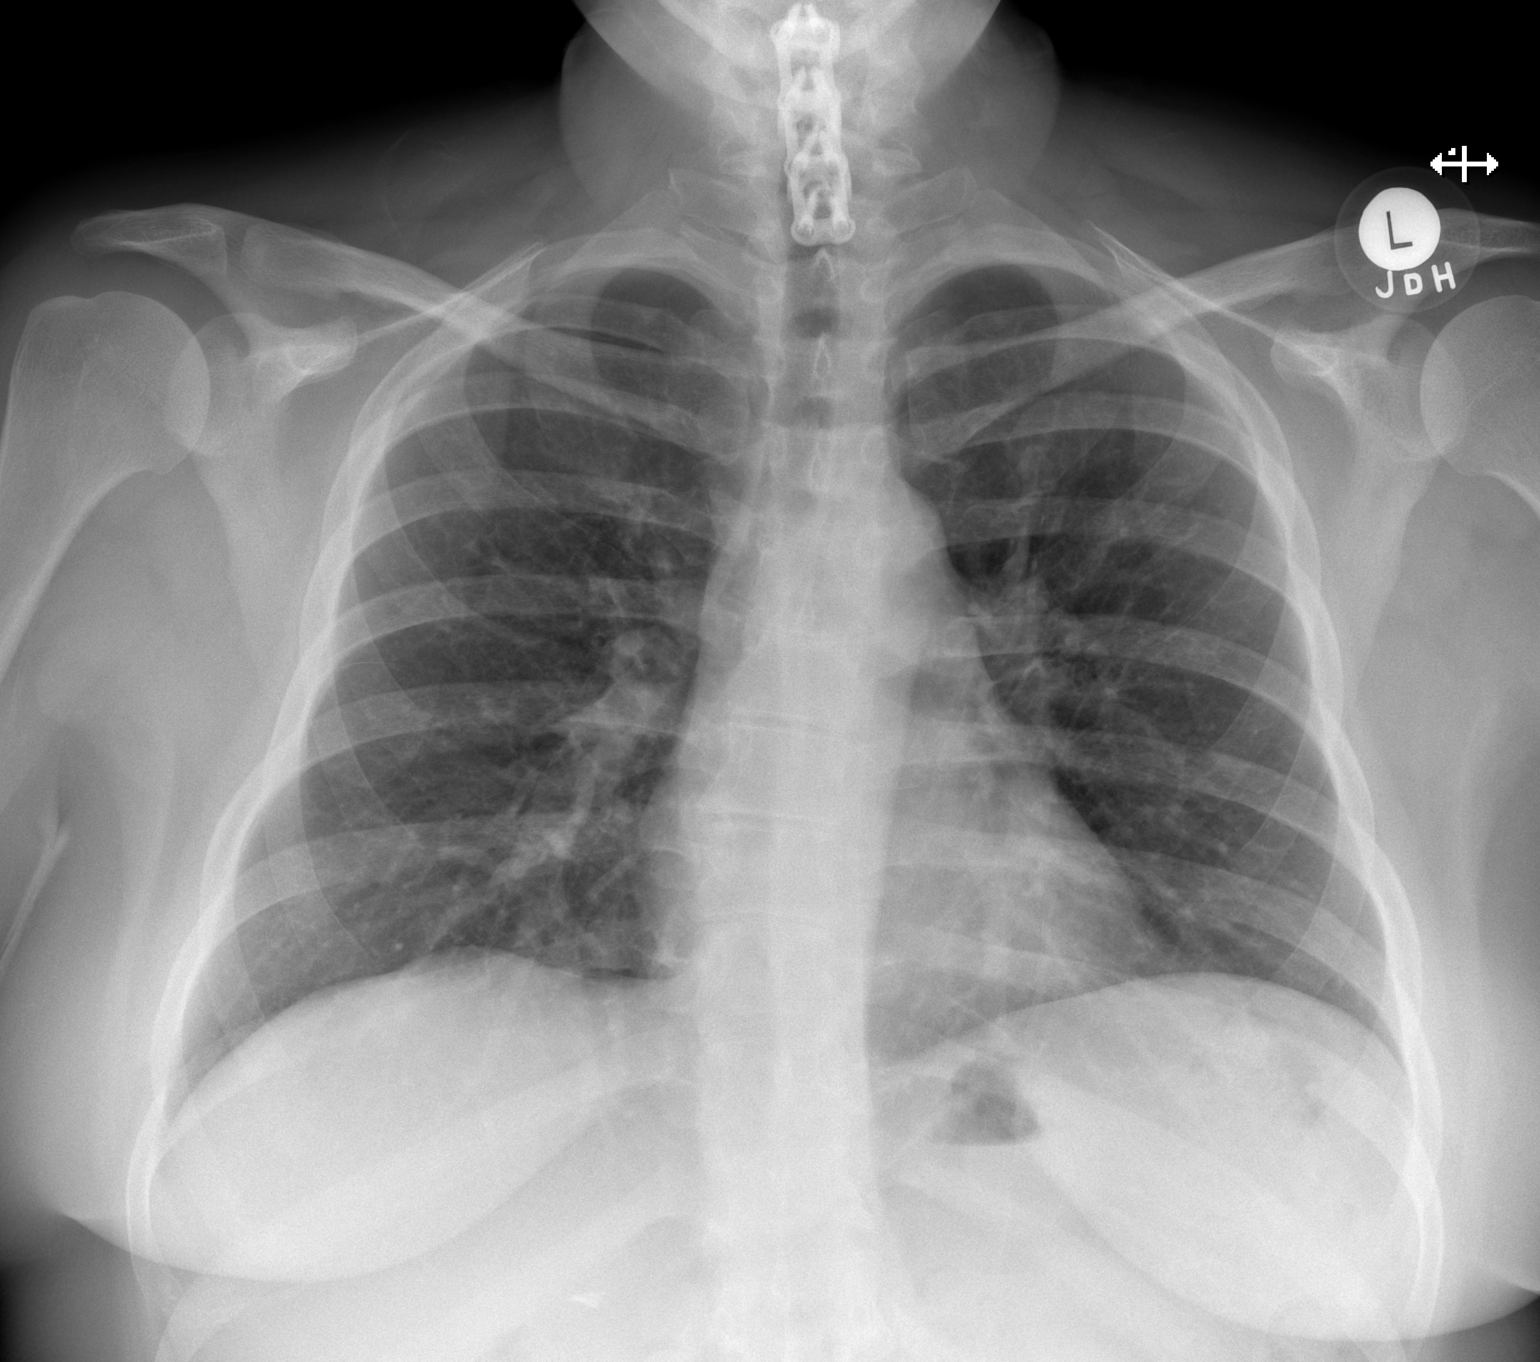

[w chest lat]
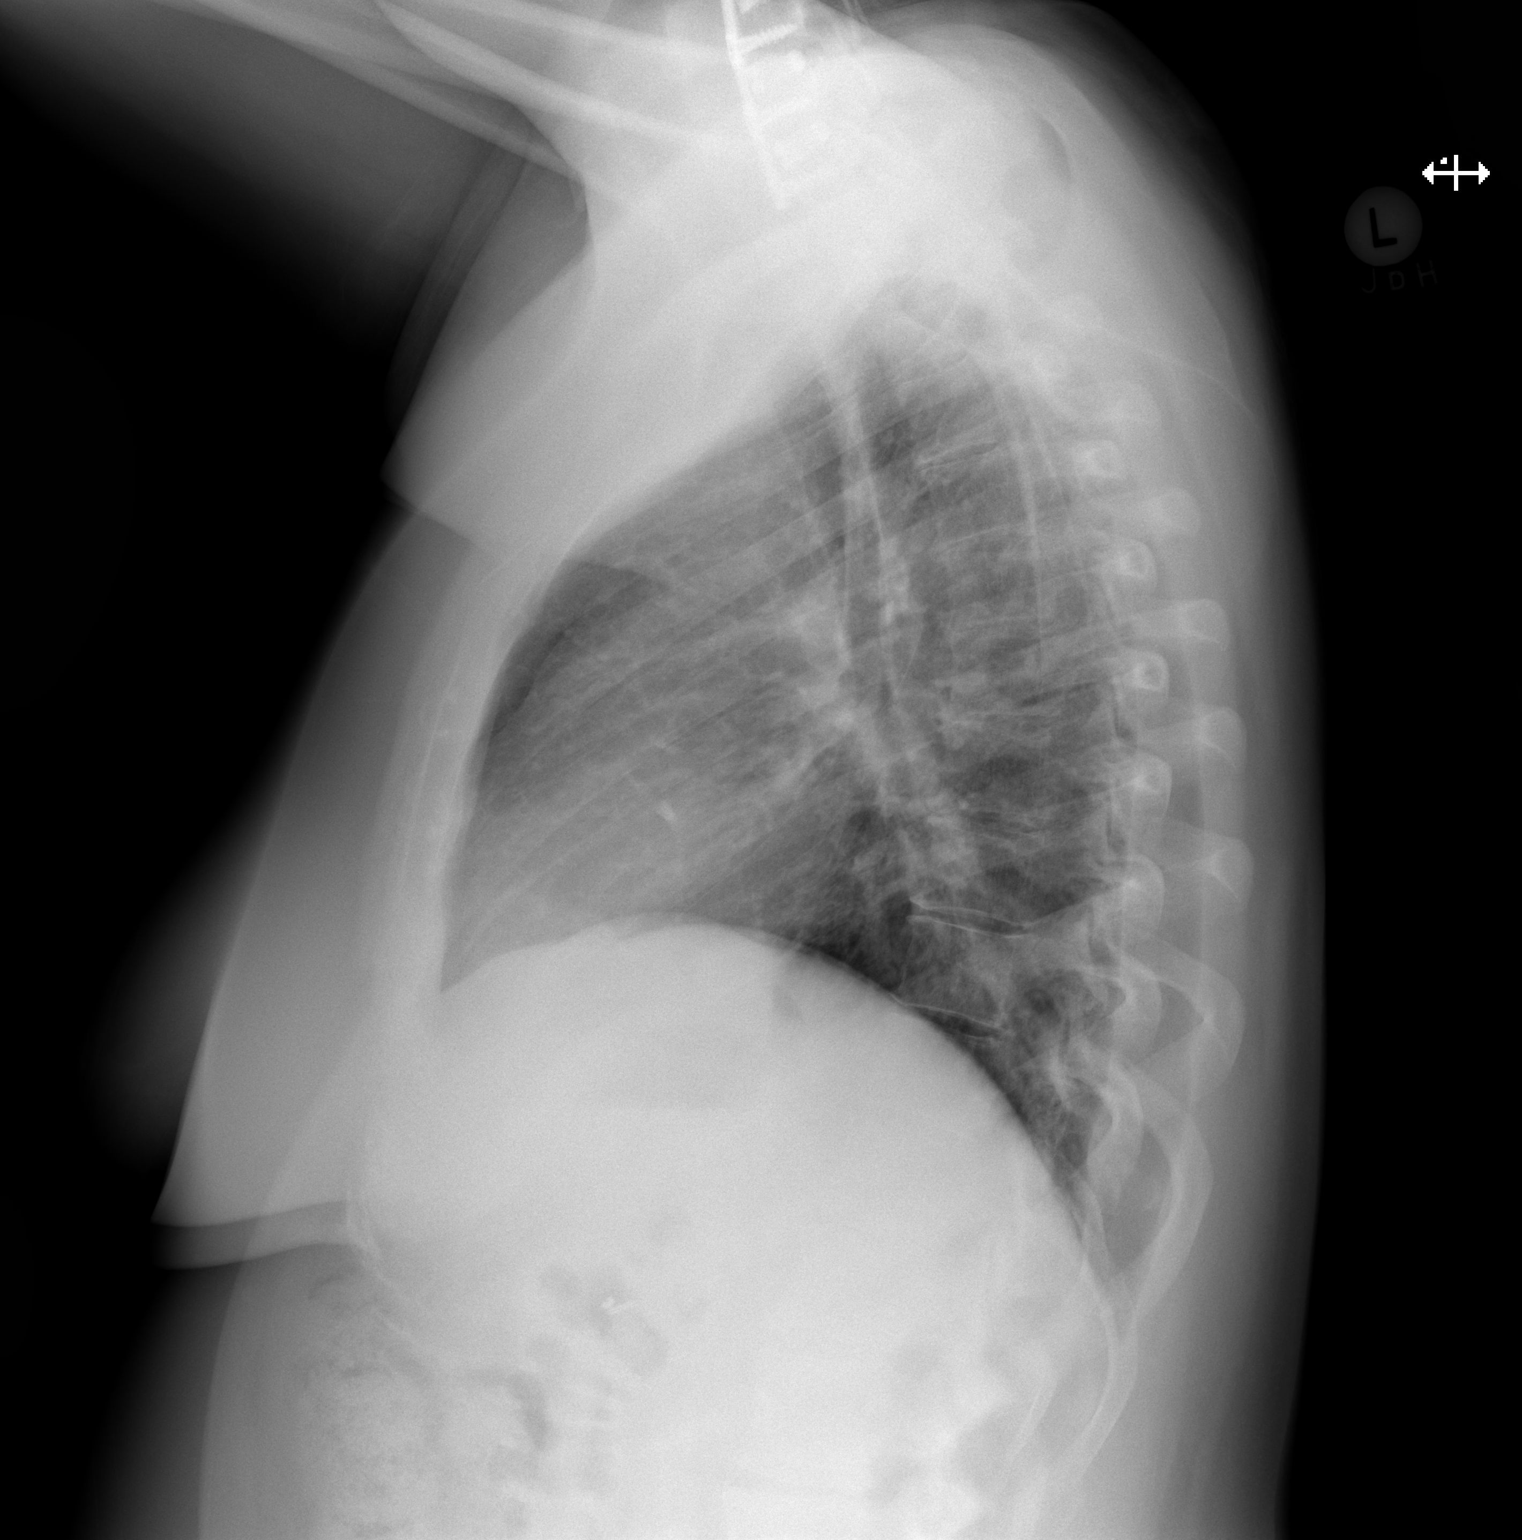

[2 of 2 positions shown; findings below may reference images not displayed]

FINDINGS: The heart size and mediastinal contours are within normal limits.
Both lungs are clear. The visualized skeletal structures are
unremarkable.
IMPRESSION: No acute abnormality of the lungs.  No focal airspace opacity.

## 2020-08-08 ENCOUNTER — Other Ambulatory Visit: Payer: Self-pay

## 2020-08-08 ENCOUNTER — Emergency Department (HOSPITAL_COMMUNITY): Payer: Commercial Managed Care - PPO

## 2020-08-08 ENCOUNTER — Encounter (HOSPITAL_COMMUNITY): Payer: Self-pay | Admitting: *Deleted

## 2020-08-08 ENCOUNTER — Emergency Department (HOSPITAL_COMMUNITY)
Admission: EM | Admit: 2020-08-08 | Discharge: 2020-08-08 | Disposition: A | Discharge status: Home / Self Care | Payer: Commercial Managed Care - PPO | Attending: Emergency Medicine | Admitting: Emergency Medicine

## 2020-08-08 DIAGNOSIS — Z7901 Long term (current) use of anticoagulants: Secondary | ICD-10-CM | POA: Diagnosis not present

## 2020-08-08 DIAGNOSIS — Z87891 Personal history of nicotine dependence: Secondary | ICD-10-CM | POA: Insufficient documentation

## 2020-08-08 DIAGNOSIS — R1030 Lower abdominal pain, unspecified: Secondary | ICD-10-CM | POA: Insufficient documentation

## 2020-08-08 DIAGNOSIS — R102 Pelvic and perineal pain: Secondary | ICD-10-CM

## 2020-08-08 DIAGNOSIS — R109 Unspecified abdominal pain: Secondary | ICD-10-CM

## 2020-08-08 LAB — COMPREHENSIVE METABOLIC PANEL
ALT: 17 U/L (ref 0–44)
AST: 19 U/L (ref 15–41)
Albumin: 3.8 g/dL (ref 3.5–5.0)
Alkaline Phosphatase: 55 U/L (ref 38–126)
Anion gap: 6 (ref 5–15)
BUN: 11 mg/dL (ref 6–20)
CO2: 25 mmol/L (ref 22–32)
Calcium: 8.7 mg/dL — ABNORMAL LOW (ref 8.9–10.3)
Chloride: 106 mmol/L (ref 98–111)
Creatinine, Ser: 0.92 mg/dL (ref 0.44–1.00)
GFR, Estimated: >60 mL/min (ref >60)
Glucose, Bld: 80 mg/dL (ref 70–99)
Potassium: 3.6 mmol/L (ref 3.5–5.1)
Sodium: 137 mmol/L (ref 135–145)
Total Bilirubin: 0.5 mg/dL (ref 0.3–1.2)
Total Protein: 7.1 g/dL (ref 6.5–8.1)

## 2020-08-08 LAB — WET PREP, GENITAL
Clue Cells Wet Prep HPF POC: NONE SEEN
Sperm: NONE SEEN
Trich, Wet Prep: NONE SEEN
Yeast Wet Prep HPF POC: NONE SEEN

## 2020-08-08 LAB — CBC WITH DIFFERENTIAL/PLATELET
Abs Immature Granulocytes: 0.01 10*3/uL (ref 0.00–0.07)
Basophils Absolute: 0.1 10*3/uL (ref 0.0–0.1)
Basophils Relative: 1 %
Eosinophils Absolute: 0.2 10*3/uL (ref 0.0–0.5)
Eosinophils Relative: 3 %
HCT: 36.7 % (ref 36.0–46.0)
Hemoglobin: 11.5 g/dL — ABNORMAL LOW (ref 12.0–15.0)
Immature Granulocytes: 0 %
Lymphocytes Relative: 26 %
Lymphs Abs: 1.4 10*3/uL (ref 0.7–4.0)
MCH: 27.3 pg (ref 26.0–34.0)
MCHC: 31.3 g/dL (ref 30.0–36.0)
MCV: 87.2 fL (ref 80.0–100.0)
Monocytes Absolute: 0.6 10*3/uL (ref 0.1–1.0)
Monocytes Relative: 10 %
Neutro Abs: 3.3 10*3/uL (ref 1.7–7.7)
Neutrophils Relative %: 60 %
Platelets: 202 10*3/uL (ref 150–400)
RBC: 4.21 MIL/uL (ref 3.87–5.11)
RDW: 13.6 % (ref 11.5–15.5)
WBC: 5.5 10*3/uL (ref 4.0–10.5)
nRBC: 0 % (ref 0.0–0.2)

## 2020-08-08 LAB — URINALYSIS, ROUTINE W REFLEX MICROSCOPIC
Bilirubin Urine: NEGATIVE
Glucose, UA: NEGATIVE mg/dL
Hgb urine dipstick: NEGATIVE
Ketones, ur: NEGATIVE mg/dL
Leukocytes,Ua: NEGATIVE
Nitrite: NEGATIVE
Protein, ur: NEGATIVE mg/dL
Specific Gravity, Urine: 1.003 — ABNORMAL LOW (ref 1.005–1.030)
pH: 8 (ref 5.0–8.0)

## 2020-08-08 LAB — LIPASE, BLOOD: Lipase: 36 U/L (ref 11–51)

## 2020-08-08 LAB — PREGNANCY, URINE: Preg Test, Ur: NEGATIVE

## 2020-08-08 MED ORDER — ONDANSETRON HCL 4 MG/2ML IJ SOLN
4.0000 mg | Freq: Once | INTRAMUSCULAR | Status: AC
  Administered 2020-08-08: 4 mg via INTRAVENOUS
  Filled 2020-08-08: qty 2

## 2020-08-08 MED ORDER — HYDROMORPHONE HCL 1 MG/ML IJ SOLN
0.5000 mg | Freq: Once | INTRAMUSCULAR | Status: AC
  Administered 2020-08-08: 0.5 mg via INTRAVENOUS
  Filled 2020-08-08: qty 1

## 2020-08-08 MED ORDER — HYDROMORPHONE HCL 1 MG/ML IJ SOLN
1.0000 mg | Freq: Once | INTRAMUSCULAR | Status: AC
  Administered 2020-08-08: 1 mg via INTRAVENOUS
  Filled 2020-08-08: qty 1

## 2020-08-08 NOTE — ED Provider Notes (Addendum)
[facility] EMERGENCY DEPARTMENT Provider Note   CSN: [id number] Arrival date & time: 08/08/20  1349     History Chief Complaint  Patient presents with  . Abdominal Pain    Katrina Osborne is a 39 y.o. female with past medical history significant for PE, DVT, lupus on Plaquenil, Hashimoto's disease, left ovarian torsion.  Anticoagulated on Eliquis. Abdominal surgical history includes cholecystectomy.  HPI Patient presents to emergency department today with chief complaint of lower abdominal pain x4 days.  She states the pain is been progressively worsening.  She describes the pain as a cramping sensation.  She states it feels like menstrual cramps however significantly more intense. Pain is worse in left lower quadrant. The pain radiates to her back.  She rates the pain 9/10 in severity.  She states the pain is worse with movement.  She has tried taking Tylenol and ibuprofen without symptom relief.  She denies any associated nausea or emesis.  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  She denies any fever, chills, vaginal discharge, abnormal vaginal bleeding, urinary frequency, dysuria, gross hematuria, diarrhea.  Her last bowel movement was today and it was normal.  LMP was 07/22/2020.  Patient states she is not concerned for STIs.  She is only sexually active with her wife. Patient reports history of she large left ovarian cyst in 2012 was causing torsion.  The cyst was removed, ovaries still intact.    Past Medical History:  Diagnosis Date  . DVT (deep venous thrombosis) (HCC)   . Hashimoto's disease   . Hashimoto's disease 09/22/2015  . History of herniated intervertebral disc   . Kidney stone   . Lupus (HCC)   . Ovarian torsion   . Saddle embolism of pulmonary artery (HCC)   . Thyroid disease     Patient Active Problem List   Diagnosis Date Noted  . DVT (deep venous thrombosis) (HCC) 06/20/2019     Past Surgical History:  Procedure Laterality Date  . BACK SURGERY    . CHOLECYSTECTOMY    . NECK SURGERY     c4-c7  . OVARY SURGERY    . TONSILLECTOMY       OB History    Gravida  1   Para      Term      Preterm      AB      Living        SAB      TAB      Ectopic      Multiple      Live Births              Family History  Problem Relation Age of Onset  . Thyroid disease Mother   . Pulmonary embolism Father     Social History   Tobacco Use  . Smoking status: Former Smoker    Quit date: 09/15/2012    Years since quitting: 7.9  . Smokeless tobacco: Never Used  Vaping Use  . Vaping Use: Never used  Substance Use Topics  . Alcohol use: No  . Drug use: Not on file    Home Medications Prior to Admission medications   Medication Sig Start Date End Date Taking? Authorizing Provider  albuterol (VENTOLIN HFA) 108 (90 Base) MCG/ACT inhaler Inhale 1-2 puffs into the lungs every 6 (six) hours as needed for wheezing or shortness of breath.  [provider]  ALPRAZolam Prudy Feeler) 0.5 MG tablet Take 0.5 mg by mouth at bedtime.  03/28/19   [provider]  apixaban (ELIQUIS) 5 MG TABS tablet Take 5 mg by mouth 2 (two) times daily.    [provider]  Ascorbic Acid (VITAMIN C GUMMIE PO) Take 1 tablet by mouth daily.    [provider]  clobetasol cream (TEMOVATE) 0.05 % Apply 1 application topically 2 (two) times daily as needed.  04/09/19   [provider]  cyclobenzaprine (FLEXERIL) 5 MG tablet Take 5 mg by mouth daily as needed for muscle spasms.    [provider]  escitalopram (LEXAPRO) 5 MG tablet Take 5 mg by mouth daily. 10/28/19   [provider]  ferrous gluconate (FERGON) 324 MG tablet Take 324 mg by mouth daily with breakfast.    [provider]  fluticasone (FLONASE) 50 MCG/ACT nasal spray Place 2 sprays into the nose daily as needed for allergies.     [provider]   HYDROcodone-acetaminophen (NORCO/VICODIN) 5-325 MG tablet Take 1 tablet by mouth every 6 (six) hours as needed for moderate pain.    [provider]  levothyroxine (SYNTHROID) 50 MCG tablet Take 50 mcg by mouth every morning. 09/07/19   [provider]  naloxone [facility]) nasal spray 4 mg/0.1 mL Administer a single spray intranasally into one nostril.  Call 911.  May repeat x1 in 2 to 3 minutes using a new nasal spray. 02/23/17   [provider]  omeprazole (PRILOSEC) 40 MG capsule Take 40 mg by mouth every morning.  01/27/19   [provider]  ondansetron (ZOFRAN) 4 MG tablet Take 4 mg by mouth every 8 (eight) hours as needed for nausea or vomiting.  02/22/19   [provider]  oxyCODONE-acetaminophen (PERCOCET/ROXICET) 5-325 MG tablet Take 1 tablet by mouth 2 (two) times daily as needed for severe pain.    [provider]  PLAQUENIL 200 MG tablet Take 200 mg by mouth 2 (two) times daily with a meal. 02/22/19   [provider]  predniSONE (DELTASONE) 5 MG tablet Take 5 mg by mouth See admin instructions. To take as directed/as needed for flare ups; 30mg  on first, 20mg  on second, 10mg  on third, and 5mg  on fourth 05/23/19   [provider]    Allergies    Morphine and related and Penicillins  Review of Systems   Review of Systems All other systems are reviewed and are negative for acute change except as noted in the HPI.  Physical Exam Updated Vital Signs BP 133/79   Pulse 60   Temp 98.3 F (36.8 C) (Oral)   Resp 19   Ht 5\' 6"  (1.676 m)   Wt 117.9 kg   LMP 07/22/2020   SpO2 100%   BMI 41.97 kg/m   Physical Exam Vitals and nursing note reviewed.  Constitutional:      General: She is not in acute distress.    Appearance: She is not ill-appearing.  HENT:     Head: Normocephalic and atraumatic.     Right Ear: Tympanic membrane and external ear normal.     Left Ear: Tympanic membrane and external ear normal.     Nose:  Nose normal.     Mouth/Throat:     Mouth: Mucous membranes are moist.     Pharynx: Oropharynx is clear.  Eyes:     General: No scleral icterus.       Right eye: No discharge.  Left eye: No discharge.     Extraocular Movements: Extraocular movements intact.     Conjunctiva/sclera: Conjunctivae normal.     Pupils: Pupils are equal, round, and reactive to light.  Neck:     Vascular: No JVD.  Cardiovascular:     Rate and Rhythm: Normal rate and regular rhythm.     Pulses: Normal pulses.          Radial pulses are 2+ on the right side and 2+ on the left side.     Heart sounds: Normal heart sounds.  Pulmonary:     Comments: Lungs clear to auscultation in all fields. Symmetric chest rise. No wheezing, rales, or rhonchi. Abdominal:     Tenderness: There is no right CVA tenderness or left CVA tenderness. Negative signs include Rovsing's sign, McBurney's sign, psoas sign and obturator sign.     Comments: Abdomen is soft, non-distended. Mild tenderness to palpation in bilateral lower quadrants and suprapubic area. No rigidity, no guarding. No peritoneal signs.  Genitourinary:    Comments: Normal external genitalia. No pain with speculum insertion. Closed cervical os with normal appearance - no rash or lesions. No significant discharge or bleeding noted from cervix or in vaginal vault. On bimanual examination no adnexal tenderness or cervical motion tenderness. Chaperone Administrator, [employer] RN present during exam.  Musculoskeletal:        General: Normal range of motion.     Cervical back: Normal range of motion.  Skin:    General: Skin is warm and dry.     Capillary Refill: Capillary refill takes less than 2 seconds.  Neurological:     Mental Status: She is oriented to person, place, and time.     GCS: GCS eye subscore is 4. GCS verbal subscore is 5. GCS motor subscore is 6.     Comments: Fluent speech, no facial droop.  Psychiatric:        Behavior: Behavior normal.     ED Results / Procedures  / Treatments   Labs (all labs ordered are listed, but only abnormal results are displayed) Labs Reviewed  WET PREP, GENITAL - Abnormal; Notable for the following components:      Result Value   WBC, Wet Prep HPF POC FEW (*)    All other components within normal limits  CBC WITH DIFFERENTIAL/PLATELET - Abnormal; Notable for the following components:   Hemoglobin 11.5 (*)    All other components within normal limits  COMPREHENSIVE METABOLIC PANEL - Abnormal; Notable for the following components:   Calcium 8.7 (*)    All other components within normal limits  URINALYSIS, ROUTINE W REFLEX MICROSCOPIC - Abnormal; Notable for the following components:   Color, Urine STRAW (*)    Specific Gravity, Urine 1.003 (*)    All other components within normal limits  LIPASE, BLOOD  PREGNANCY, URINE  GC/CHLAMYDIA PROBE AMP ([place]    EKG None  Radiology US PELVIC COMPLETE W TRANSVAGINAL AND TORSION R/O  Result Date: 08/08/2020 CLINICAL DATA:  39 year old female with right lower quadrant abdominal pain. EXAM: TRANSABDOMINAL AND TRANSVAGINAL ULTRASOUND OF PELVIS DOPPLER ULTRASOUND OF OVARIES TECHNIQUE: Both transabdominal and transvaginal ultrasound examinations of the pelvis were performed. Transabdominal technique was performed for global imaging of the pelvis including uterus, ovaries, adnexal regions, and pelvic cul-de-sac. It was necessary to proceed with endovaginal exam following the transabdominal exam to visualize the endometrium and ovaries. Color and duplex Doppler ultrasound was utilized to evaluate blood flow to the ovaries. COMPARISON:  None. FINDINGS: Uterus Measurements: 8.4 x 4.5 x 4.6 cm = volume: 91 mL. The uterus is anteverted. There is a 2.3 x 2.2 x 2.0 cm anterior fundal subserosal fibroid. Endometrium Thickness: 13 mm.  No focal abnormality visualized. Right ovary Measurements: 3.4 x 1.6 x 2.6 cm = volume: 6.7 mL. Normal appearance/no adnexal mass. Left ovary  Measurements: 3.0 x 2.4 x 2.2 cm = volume: 8.1 mL. Normal appearance/no adnexal mass. Pulsed Doppler evaluation of both ovaries demonstrates normal low-resistance arterial and venous waveforms. Other findings No abnormal free fluid. IMPRESSION: 1. Small anterior fundal subserosal fibroid. 2. Unremarkable endometrium and ovaries. Electronically Signed   By: Elgie Collard M.D.   On: 08/08/2020 16:51    Procedures Procedures (including critical care time)  Medications Ordered in ED Medications  HYDROmorphone (DILAUDID) injection 0.5 mg (0.5 mg Intravenous Given 08/08/20 1545)  ondansetron (ZOFRAN) injection 4 mg (4 mg Intravenous Given 08/08/20 1545)  HYDROmorphone (DILAUDID) injection 1 mg (1 mg Intravenous Given 08/08/20 1758)    ED Course  I have reviewed the triage vital signs and the nursing notes.  Pertinent labs & imaging results that were available during my care of the patient were reviewed by me and considered in my medical decision making (see chart for details).    MDM Rules/Calculators/A&P                          History provided by patient with additional history obtained from chart review.    Patient presents to the ED with complaints of abdominal pain. Patient nontoxic appearing, in no apparent distress, vitals WNL. On exam patient tender to bilateral lower quadrants and suprapubic area, no peritoneal signs. No tenderness at McBurney's point, negative Rovsing and obturator sign.  Will evaluate with labs and pelvic US. Analgesics, anti-emetics, and fluids administered.  Pelvic exam performed with chaperone present.  No discharge seen, no bleeding, no cervical motion tenderness, no adnexal tenderness.  Labs reviewed and grossly unremarkable. No leukocytosis, hemoglobin consistent with baseline, no significant electrolyte derangements. LFTs, renal function, and lipase WNL. Urinalysis without obvious infection. Wet prep shows few WBC, otherwise negative. Imaging show no signs  of ovarian torsion, no ovarian cysts. Does have a small uterine fibroid.   On repeat abdominal exam patient remains without peritoneal signs, doubt cholecystitis, pancreatitis, diverticulitis, appendicitis, bowel obstruction/perforation. Patient tolerating PO in the emergency department. Pain has improved. Engaged in shared decision making and she feels she can manage symptoms at home. Does not wish to proceed with CT AP to further evaluate pain.  Will discharge home with supportive measures. I discussed results, treatment plan, need for PCP and GYN follow-up, and return precautions with the patient. Provided opportunity for questions, patient confirmed understanding and is in agreement with plan.    Portions of this note were generated with Scientist, clinical (histocompatibility and immunogenetics). Dictation errors may occur despite best attempts at proofreading.   Final Clinical Impression(s) / ED Diagnoses Final diagnoses:  Abdominal pain, unspecified abdominal location    Rx / DC Orders ED Discharge Orders    None       Shanon Ace, PA-C 08/08/20 1850    Shanon Ace, PA-C 08/08/20 2007    Shon Baton, MD 08/08/20 2250

## 2020-08-08 NOTE — ED Triage Notes (Signed)
Pt with lower abd pain that radiates around to back on right side.  Hx of ovarian torsion.  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

## 2020-08-08 NOTE — ED Triage Notes (Signed)
Pt states sitting makes pain worse and that standing makes pain better.

## 2020-08-08 NOTE — Discharge Instructions (Addendum)
Your work-up today was overall unremarkable.  The ultrasound showed no ovarian cysts or signs of ovarian torsion.  It did show you have a 3 x 2x2 cm fibroid on your uterus.  Recommend you follow-up with GYN as we discussed.  You can also try to follow-up with PCP if needed.  Return to the emergency department if you have any new or worsening symptoms.  Thank you for allowing [place] to care for you today.

## 2020-08-09 LAB — GC/CHLAMYDIA PROBE AMP (~~LOC~~) NOT AT ARMC
Chlamydia: NEGATIVE
Comment: NEGATIVE
Comment: NORMAL
Neisseria Gonorrhea: NEGATIVE

## 2020-11-21 ENCOUNTER — Emergency Department (HOSPITAL_BASED_OUTPATIENT_CLINIC_OR_DEPARTMENT_OTHER): Payer: Commercial Managed Care - PPO

## 2020-11-21 ENCOUNTER — Emergency Department (HOSPITAL_BASED_OUTPATIENT_CLINIC_OR_DEPARTMENT_OTHER)
Admission: EM | Admit: 2020-11-21 | Discharge: 2020-11-21 | Disposition: A | Discharge status: Home / Self Care | Payer: Commercial Managed Care - PPO | Attending: Emergency Medicine | Admitting: Emergency Medicine

## 2020-11-21 ENCOUNTER — Encounter (HOSPITAL_BASED_OUTPATIENT_CLINIC_OR_DEPARTMENT_OTHER): Payer: Self-pay | Admitting: Emergency Medicine

## 2020-11-21 ENCOUNTER — Other Ambulatory Visit: Payer: Self-pay

## 2020-11-21 DIAGNOSIS — M549 Dorsalgia, unspecified: Secondary | ICD-10-CM | POA: Insufficient documentation

## 2020-11-21 DIAGNOSIS — Z87891 Personal history of nicotine dependence: Secondary | ICD-10-CM | POA: Diagnosis not present

## 2020-11-21 DIAGNOSIS — Z7901 Long term (current) use of anticoagulants: Secondary | ICD-10-CM | POA: Insufficient documentation

## 2020-11-21 DIAGNOSIS — R0602 Shortness of breath: Secondary | ICD-10-CM | POA: Insufficient documentation

## 2020-11-21 DIAGNOSIS — R079 Chest pain, unspecified: Secondary | ICD-10-CM

## 2020-11-21 DIAGNOSIS — Z79899 Other long term (current) drug therapy: Secondary | ICD-10-CM | POA: Insufficient documentation

## 2020-11-21 LAB — BASIC METABOLIC PANEL
Anion gap: 9 (ref 5–15)
BUN: 11 mg/dL (ref 6–20)
CO2: 26 mmol/L (ref 22–32)
Calcium: 9 mg/dL (ref 8.9–10.3)
Chloride: 103 mmol/L (ref 98–111)
Creatinine, Ser: 0.99 mg/dL (ref 0.44–1.00)
GFR, Estimated: >60 mL/min (ref >60)
Glucose, Bld: 90 mg/dL (ref 70–99)
Potassium: 3.8 mmol/L (ref 3.5–5.1)
Sodium: 138 mmol/L (ref 135–145)

## 2020-11-21 LAB — CBC WITH DIFFERENTIAL/PLATELET
Abs Immature Granulocytes: 0.02 10*3/uL (ref 0.00–0.07)
Basophils Absolute: 0.1 10*3/uL (ref 0.0–0.1)
Basophils Relative: 1 %
Eosinophils Absolute: 0.1 10*3/uL (ref 0.0–0.5)
Eosinophils Relative: 1 %
HCT: 39.4 % (ref 36.0–46.0)
Hemoglobin: 12.6 g/dL (ref 12.0–15.0)
Immature Granulocytes: 0 %
Lymphocytes Relative: 30 %
Lymphs Abs: 2.4 10*3/uL (ref 0.7–4.0)
MCH: 27.4 pg (ref 26.0–34.0)
MCHC: 32 g/dL (ref 30.0–36.0)
MCV: 85.7 fL (ref 80.0–100.0)
Monocytes Absolute: 0.6 10*3/uL (ref 0.1–1.0)
Monocytes Relative: 8 %
Neutro Abs: 4.7 10*3/uL (ref 1.7–7.7)
Neutrophils Relative %: 60 %
Platelets: 205 10*3/uL (ref 150–400)
RBC: 4.6 MIL/uL (ref 3.87–5.11)
RDW: 14.3 % (ref 11.5–15.5)
WBC: 7.9 10*3/uL (ref 4.0–10.5)
nRBC: 0 % (ref 0.0–0.2)

## 2020-11-21 LAB — TROPONIN I (HIGH SENSITIVITY): Troponin I (High Sensitivity): <2 ng/L (ref <18)

## 2020-11-21 LAB — D-DIMER, QUANTITATIVE: D-Dimer, Quant: 0.39 ug/mL-FEU (ref 0.00–0.50)

## 2020-11-21 NOTE — Discharge Instructions (Signed)
If you develop recurrent, continued, or worsening chest pain, shortness of breath, fever, vomiting, abdominal or back pain, or any other new/concerning symptoms then return to the ER for evaluation.  

## 2020-11-21 NOTE — ED Notes (Signed)
Patient transported to X-ray 

## 2020-11-21 NOTE — ED Notes (Signed)
Patient verbalized understanding of dc instructions, prescriptions, follow up referrals and reasons to return to ER for reevaluation.  

## 2020-11-21 NOTE — ED Provider Notes (Signed)
[facility] EMERGENCY DEPARTMENT Provider Note   CSN: [id number] Arrival date & time: 11/21/20  2841     History Chief Complaint  Patient presents with  . Chest Pain    Katrina Osborne is a 40 y.o. female.  HPI 40 year old female presents with chest pain.  Patient developed this pain about 2-1/2 days ago.  About a week ago she had an ablation/D&C for endometriosis.  The pain she is having now is in her left lower chest as well as left back.  Feels like a vice grip.  Worse with deep breathing.  Some shortness of breath when she walks.  No cough or fever.  This does remind her of when she had a PE a few years ago.  Also reminds her when she had pleurisy last year. Pain is moderate. No new calf swelling. She has been compliant with her eliquis except when she had to stop it for 1 day for surgery.  Past Medical History:  Diagnosis Date  . DVT (deep venous thrombosis) (HCC)   . Hashimoto's disease   . Hashimoto's disease 09/22/2015  . History of herniated intervertebral disc   . Kidney stone   . Lupus (HCC)   . Ovarian torsion   . Saddle embolism of pulmonary artery (HCC)   . Thyroid disease     Patient Active Problem List   Diagnosis Date Noted  . DVT (deep venous thrombosis) (HCC) 06/20/2019    Past Surgical History:  Procedure Laterality Date  . ABLATION ON ENDOMETRIOSIS    . BACK SURGERY    . CHOLECYSTECTOMY    . NECK SURGERY     c4-c7  . OVARY SURGERY    . TONSILLECTOMY       OB History    Gravida  1   Para      Term      Preterm      AB      Living        SAB      IAB      Ectopic      Multiple      Live Births              Family History  Problem Relation Age of Onset  . Thyroid disease Mother   . Pulmonary embolism Father     Social History   Tobacco Use  . Smoking status: Former Smoker    Quit date: 09/15/2012    Years since quitting: 8.1  . Smokeless tobacco: Never Used  Vaping Use  . Vaping Use: Never used   Substance Use Topics  . Alcohol use: No  . Drug use: Never    Home Medications Prior to Admission medications   Medication Sig Start Date End Date Taking? Authorizing Provider  albuterol (VENTOLIN HFA) 108 (90 Base) MCG/ACT inhaler Inhale 1-2 puffs into the lungs every 6 (six) hours as needed for wheezing or shortness of breath.     [provider]  ALPRAZolam Prudy Feeler) 0.5 MG tablet Take 0.5 mg by mouth at bedtime.  03/28/19   [provider]  apixaban (ELIQUIS) 5 MG TABS tablet Take 5 mg by mouth 2 (two) times daily.    [provider]  Ascorbic Acid (VITAMIN C GUMMIE PO) Take 1 tablet by mouth daily.    [provider]  clobetasol cream (TEMOVATE) 0.05 % Apply 1 application topically 2 (two) times daily as needed.  04/09/19   [provider]  cyclobenzaprine (FLEXERIL) 5 MG  tablet Take 5 mg by mouth daily as needed for muscle spasms.    [provider]  escitalopram (LEXAPRO) 5 MG tablet Take 5 mg by mouth daily. 10/28/19   [provider]  ferrous gluconate (FERGON) 324 MG tablet Take 324 mg by mouth daily with breakfast.    [provider]  fluticasone (FLONASE) 50 MCG/ACT nasal spray Place 2 sprays into the nose daily as needed for allergies.     [provider]  HYDROcodone-acetaminophen (NORCO/VICODIN) 5-325 MG tablet Take 1 tablet by mouth every 6 (six) hours as needed for moderate pain.    [provider]  levothyroxine (SYNTHROID) 50 MCG tablet Take 50 mcg by mouth every morning. 09/07/19   [provider]  naloxone [facility]) nasal spray 4 mg/0.1 mL Administer a single spray intranasally into one nostril.  Call 911.  May repeat x1 in 2 to 3 minutes using a new nasal spray. 02/23/17   [provider]  omeprazole (PRILOSEC) 40 MG capsule Take 40 mg by mouth every morning.  01/27/19   [provider]  ondansetron (ZOFRAN) 4 MG tablet Take 4 mg by mouth every 8 (eight) hours as  needed for nausea or vomiting.  02/22/19   [provider]  oxyCODONE-acetaminophen (PERCOCET/ROXICET) 5-325 MG tablet Take 1 tablet by mouth 2 (two) times daily as needed for severe pain.    [provider]  PLAQUENIL 200 MG tablet Take 200 mg by mouth 2 (two) times daily with a meal. 02/22/19   [provider]  predniSONE (DELTASONE) 5 MG tablet Take 5 mg by mouth See admin instructions. To take as directed/as needed for flare ups; 30mg  on first, 20mg  on second, 10mg  on third, and 5mg  on fourth 05/23/19   [provider]    Allergies    Morphine and related and Penicillins  Review of Systems   Review of Systems  Constitutional: Negative for fever.  Respiratory: Positive for shortness of breath. Negative for cough.   Cardiovascular: Positive for chest pain. Negative for leg swelling.  All other systems reviewed and are negative.   Physical Exam Updated Vital Signs BP 115/69   Pulse 61   Temp 98 F (36.7 C) (Oral)   Resp 19   Ht 5\' 6"  (1.676 m)   Wt 120.7 kg   SpO2 100%   BMI 42.93 kg/m   Physical Exam Vitals and nursing note reviewed.  Constitutional:      Appearance: She is well-developed and well-nourished.  HENT:     Head: Normocephalic and atraumatic.     Right Ear: External ear normal.     Left Ear: External ear normal.     Nose: Nose normal.  Eyes:     General:        Right eye: No discharge.        Left eye: No discharge.  Cardiovascular:     Rate and Rhythm: Normal rate and regular rhythm.     Heart sounds: Normal heart sounds.  Pulmonary:     Effort: Pulmonary effort is normal.     Breath sounds: Normal breath sounds.  Abdominal:     Palpations: Abdomen is soft.     Tenderness: There is no abdominal tenderness.  Skin:    General: Skin is warm and dry.  Neurological:     Mental Status: She is alert.  Psychiatric:        Mood and Affect: Mood is not anxious.     ED Results /  Procedures / Treatments   Labs (all labs  ordered are listed, but only abnormal results are displayed) Labs Reviewed  BASIC METABOLIC PANEL  CBC WITH DIFFERENTIAL/PLATELET  D-DIMER, QUANTITATIVE  PREGNANCY, URINE  TROPONIN I (HIGH SENSITIVITY)    EKG EKG Interpretation  Date/Time:  Thursday November 21 2020 07:16:12 EST Ventricular Rate:  60 PR Interval:    QRS Duration: 93 QT Interval:  428 QTC Calculation: 428 R Axis:   38 Text Interpretation: Sinus rhythm Low voltage, precordial leads no acute ST/T changes Confirmed by Pricilla Loveless [phone]) on 11/21/2020 7:26:50 AM   Radiology No results found.  Procedures Procedures   Medications Ordered in ED Medications - No data to display  ED Course  I have reviewed the triage vital signs and the nursing notes.  Pertinent labs & imaging results that were available during my care of the patient were reviewed by me and considered in my medical decision making (see chart for details).    MDM Rules/Calculators/A&P                          Patient's ECG and labs are benign.  I had a discussion with her about work-up for potential PE in this scenario.  While she has been compliant with Eliquis, she did miss a dose or 2 because of the surgery.  However she is also not tachycardic, not hypoxic, or in any distress.  I think this is unlikely to be significant PE if PE at all.  Thus we decided to do D-dimer.  This is negative and I think it is reasonable to hold off on CT which she agrees with.  As far as other potential causes, probably is pleurisy like she had last time.  Discussed Tylenol given she cannot take NSAIDs.  We discussed return precautions but at this point and it is unlikely she has ACS, PE, dissection, etc.  As for her chest x-ray, the wet read by the radiologist due to radiology imaging technology issues at this time indicates no acute process. Final Clinical Impression(s) / ED Diagnoses Final diagnoses:  Nonspecific chest pain    Rx / DC Orders ED Discharge Orders     None       Pricilla Loveless, MD 11/21/20 [phone]

## 2020-11-21 NOTE — ED Triage Notes (Signed)
Pt states she is having chest pain that started 2.5 days ago  Pt states the pain is under the left breast  Pt has taken ibuprofen and muscle relaxers for the pain without relief  Pt states she had surgery 7 days ago, an endometrial ablation with D&C  Pt has hx of PE

## 2020-12-02 ENCOUNTER — Other Ambulatory Visit: Payer: Self-pay | Admitting: Family Medicine

## 2020-12-02 ENCOUNTER — Ambulatory Visit
Admission: RE | Admit: 2020-12-02 | Discharge: 2020-12-02 | Disposition: A | Discharge status: Home / Self Care | Payer: Commercial Managed Care - PPO | Source: Ambulatory Visit | Attending: Family Medicine | Admitting: Family Medicine

## 2020-12-02 DIAGNOSIS — M79672 Pain in left foot: Secondary | ICD-10-CM

## 2021-01-17 ENCOUNTER — Emergency Department (HOSPITAL_COMMUNITY): Payer: Commercial Managed Care - PPO

## 2021-01-17 ENCOUNTER — Emergency Department (HOSPITAL_COMMUNITY)
Admission: EM | Admit: 2021-01-17 | Discharge: 2021-01-17 | Disposition: A | Discharge status: Home / Self Care | Payer: Commercial Managed Care - PPO | Attending: Emergency Medicine | Admitting: Emergency Medicine

## 2021-01-17 ENCOUNTER — Other Ambulatory Visit: Payer: Self-pay

## 2021-01-17 ENCOUNTER — Encounter (HOSPITAL_COMMUNITY): Payer: Self-pay

## 2021-01-17 DIAGNOSIS — M549 Dorsalgia, unspecified: Secondary | ICD-10-CM | POA: Insufficient documentation

## 2021-01-17 DIAGNOSIS — Z87891 Personal history of nicotine dependence: Secondary | ICD-10-CM | POA: Insufficient documentation

## 2021-01-17 DIAGNOSIS — Z7901 Long term (current) use of anticoagulants: Secondary | ICD-10-CM | POA: Diagnosis not present

## 2021-01-17 DIAGNOSIS — N939 Abnormal uterine and vaginal bleeding, unspecified: Secondary | ICD-10-CM | POA: Insufficient documentation

## 2021-01-17 DIAGNOSIS — R1030 Lower abdominal pain, unspecified: Secondary | ICD-10-CM | POA: Insufficient documentation

## 2021-01-17 DIAGNOSIS — R102 Pelvic and perineal pain: Secondary | ICD-10-CM | POA: Insufficient documentation

## 2021-01-17 HISTORY — DX: Antiphospholipid syndrome: D68.61

## 2021-01-17 LAB — BASIC METABOLIC PANEL
Anion gap: 6 (ref 5–15)
BUN: 12 mg/dL (ref 6–20)
CO2: 26 mmol/L (ref 22–32)
Calcium: 8.7 mg/dL — ABNORMAL LOW (ref 8.9–10.3)
Chloride: 105 mmol/L (ref 98–111)
Creatinine, Ser: 1.02 mg/dL — ABNORMAL HIGH (ref 0.44–1.00)
GFR, Estimated: >60 mL/min (ref >60)
Glucose, Bld: 104 mg/dL — ABNORMAL HIGH (ref 70–99)
Potassium: 3.9 mmol/L (ref 3.5–5.1)
Sodium: 137 mmol/L (ref 135–145)

## 2021-01-17 LAB — URINALYSIS, ROUTINE W REFLEX MICROSCOPIC
Bilirubin Urine: NEGATIVE
Glucose, UA: NEGATIVE mg/dL
Ketones, ur: NEGATIVE mg/dL
Leukocytes,Ua: NEGATIVE
Nitrite: NEGATIVE
Protein, ur: NEGATIVE mg/dL
Specific Gravity, Urine: 1.009 (ref 1.005–1.030)
pH: 7 (ref 5.0–8.0)

## 2021-01-17 LAB — CBC WITH DIFFERENTIAL/PLATELET
Abs Immature Granulocytes: 0.01 10*3/uL (ref 0.00–0.07)
Basophils Absolute: 0.1 10*3/uL (ref 0.0–0.1)
Basophils Relative: 1 %
Eosinophils Absolute: 0.1 10*3/uL (ref 0.0–0.5)
Eosinophils Relative: 2 %
HCT: 38.2 % (ref 36.0–46.0)
Hemoglobin: 12 g/dL (ref 12.0–15.0)
Immature Granulocytes: 0 %
Lymphocytes Relative: 30 %
Lymphs Abs: 1.7 10*3/uL (ref 0.7–4.0)
MCH: 27.8 pg (ref 26.0–34.0)
MCHC: 31.4 g/dL (ref 30.0–36.0)
MCV: 88.4 fL (ref 80.0–100.0)
Monocytes Absolute: 0.5 10*3/uL (ref 0.1–1.0)
Monocytes Relative: 10 %
Neutro Abs: 3.2 10*3/uL (ref 1.7–7.7)
Neutrophils Relative %: 57 %
Platelets: 201 10*3/uL (ref 150–400)
RBC: 4.32 MIL/uL (ref 3.87–5.11)
RDW: 13.6 % (ref 11.5–15.5)
WBC: 5.6 10*3/uL (ref 4.0–10.5)
nRBC: 0 % (ref 0.0–0.2)

## 2021-01-17 LAB — PREGNANCY, URINE: Preg Test, Ur: NEGATIVE

## 2021-01-17 MED ORDER — ONDANSETRON 4 MG PO TBDP: 4.0000 mg | ORAL_TABLET | Freq: Three times a day (TID) | ORAL | 0 refills | Status: AC | PRN

## 2021-01-17 MED ORDER — CEPHALEXIN 500 MG PO CAPS: 500.0000 mg | ORAL_CAPSULE | Freq: Three times a day (TID) | ORAL | 0 refills | Status: DC

## 2021-01-17 MED ORDER — IOHEXOL 300 MG/ML  SOLN
100.0000 mL | Freq: Once | INTRAMUSCULAR | Status: AC | PRN
  Administered 2021-01-17: 100 mL via INTRAVENOUS

## 2021-01-17 MED ORDER — HYDROMORPHONE HCL 1 MG/ML IJ SOLN
1.0000 mg | Freq: Once | INTRAMUSCULAR | Status: AC
  Administered 2021-01-17: 1 mg via INTRAVENOUS
  Filled 2021-01-17: qty 1

## 2021-01-17 MED ORDER — HYDROMORPHONE HCL 1 MG/ML IJ SOLN
1.0000 mg | Freq: Once | INTRAMUSCULAR | Status: AC
Start: 2021-01-17 — End: 2021-01-17
  Administered 2021-01-17: 1 mg via INTRAVENOUS
  Filled 2021-01-17: qty 1

## 2021-01-17 MED ORDER — SODIUM CHLORIDE 0.9 % IV BOLUS
1000.0000 mL | Freq: Once | INTRAVENOUS | Status: AC
  Administered 2021-01-17: 1000 mL via INTRAVENOUS

## 2021-01-17 MED ORDER — ONDANSETRON HCL 4 MG/2ML IJ SOLN
4.0000 mg | Freq: Once | INTRAMUSCULAR | Status: AC
  Administered 2021-01-17: 4 mg via INTRAVENOUS
  Filled 2021-01-17: qty 2

## 2021-01-17 NOTE — ED Notes (Signed)
Pt back from ultrasound.

## 2021-01-17 NOTE — ED Notes (Signed)
Pt c/o of feeling nausea. MD made aware. Order for zofran given.

## 2021-01-17 NOTE — Discharge Instructions (Addendum)
You were seen in the emergency department for evaluation of lower abdominal pain.  You had  blood work, pelvic ultrasound and a CT abdomen and pelvis that did not show an obvious explanation for your symptoms.  Your urine showed possible signs of infection and so we are starting you on an antibiotic.  Please follow-up with your primary care doctor and your gynecologist for continued work-up.  Return to the emergency department if any worsening symptoms or high fevers.

## 2021-01-17 NOTE — ED Provider Notes (Signed)
[facility] EMERGENCY DEPARTMENT Provider Note   CSN: [id number] Arrival date & time: 01/17/21  0831     History Chief Complaint  Patient presents with  . Abdominal Pain    Katrina Osborne is a 40 y.o. female.  She is here with a complaint of severe colicky pelvic pain that started yesterday.  Radiates into back.  No fevers nausea vomiting diarrhea.  She has had some urinary frequency and feeling of incomplete emptying of her bladder but no dysuria or hematuria.  She has had minimal vaginal bleeding and her period is due in 11 days.  No vaginal discharge.  Sexually active with 1 female partner.  She said she had a similar pain few months ago and they did not find an answer for it.  She is on chronic pain medicine for her back and her lupus.  Also on anticoagulation for prior PE.  History of ovarian cyst and torsion.  The history is provided by the patient.  Abdominal Pain Pain location:  Suprapubic Pain quality: aching, cramping and stabbing   Pain radiates to:  Back Pain severity:  Severe Onset quality:  Sudden Duration:  12 hours Timing:  Constant Progression:  Waxing and waning Chronicity:  Recurrent Context: not recent travel and not trauma   Relieved by:  Nothing Worsened by:  Nothing Ineffective treatments:  None tried Associated symptoms: vaginal bleeding   Associated symptoms: no chest pain, no constipation, no cough, no diarrhea, no dysuria, no fever, no hematemesis, no hematochezia, no hematuria, no nausea, no shortness of breath, no sore throat, no vaginal discharge and no vomiting        Past Medical History:  Diagnosis Date  . APS (antiphospholipid syndrome) (HCC)   . DVT (deep venous thrombosis) (HCC)   . Hashimoto's disease   . Hashimoto's disease 09/22/2015  . History of herniated intervertebral disc   . Kidney stone   . Lupus (HCC)   . Ovarian torsion   . Saddle embolism of pulmonary artery (HCC)   . Thyroid disease     Patient Active Problem List    Diagnosis Date Noted  . DVT (deep venous thrombosis) (HCC) 06/20/2019    Past Surgical History:  Procedure Laterality Date  . ABLATION ON ENDOMETRIOSIS    . BACK SURGERY    . CHOLECYSTECTOMY    . NECK SURGERY     c4-c7  . OVARY SURGERY    . TONSILLECTOMY       OB History    Gravida  1   Para      Term      Preterm      AB      Living        SAB      IAB      Ectopic      Multiple      Live Births              Family History  Problem Relation Age of Onset  . Thyroid disease Mother   . Pulmonary embolism Father     Social History   Tobacco Use  . Smoking status: Former Smoker    Quit date: 09/15/2012    Years since quitting: 8.3  . Smokeless tobacco: Never Used  Vaping Use  . Vaping Use: Never used  Substance Use Topics  . Alcohol use: No  . Drug use: Never    Home Medications Prior to Admission medications   Medication Sig Start Date End Date Taking? Authorizing  Provider  albuterol (VENTOLIN HFA) 108 (90 Base) MCG/ACT inhaler Inhale 1-2 puffs into the lungs every 6 (six) hours as needed for wheezing or shortness of breath.     [provider]  ALPRAZolam Prudy Feeler) 0.5 MG tablet Take 0.5 mg by mouth at bedtime.  03/28/19   [provider]  apixaban (ELIQUIS) 5 MG TABS tablet Take 5 mg by mouth 2 (two) times daily.    [provider]  Ascorbic Acid (VITAMIN C GUMMIE PO) Take 1 tablet by mouth daily.    [provider]  clobetasol cream (TEMOVATE) 0.05 % Apply 1 application topically 2 (two) times daily as needed.  04/09/19   [provider]  cyclobenzaprine (FLEXERIL) 5 MG tablet Take 5 mg by mouth daily as needed for muscle spasms.    [provider]  escitalopram (LEXAPRO) 5 MG tablet Take 5 mg by mouth daily. 10/28/19   [provider]  ferrous gluconate (FERGON) 324 MG tablet Take 324 mg by mouth daily with breakfast.    [provider]  fluticasone (FLONASE) 50 MCG/ACT nasal  spray Place 2 sprays into the nose daily as needed for allergies.     [provider]  HYDROcodone-acetaminophen (NORCO/VICODIN) 5-325 MG tablet Take 1 tablet by mouth every 6 (six) hours as needed for moderate pain.    [provider]  levothyroxine (SYNTHROID) 50 MCG tablet Take 50 mcg by mouth every morning. 09/07/19   [provider]  naloxone [facility]) nasal spray 4 mg/0.1 mL Administer a single spray intranasally into one nostril.  Call 911.  May repeat x1 in 2 to 3 minutes using a new nasal spray. 02/23/17   [provider]  omeprazole (PRILOSEC) 40 MG capsule Take 40 mg by mouth every morning.  01/27/19   [provider]  ondansetron (ZOFRAN) 4 MG tablet Take 4 mg by mouth every 8 (eight) hours as needed for nausea or vomiting.  02/22/19   [provider]  oxyCODONE-acetaminophen (PERCOCET/ROXICET) 5-325 MG tablet Take 1 tablet by mouth 2 (two) times daily as needed for severe pain.    [provider]  PLAQUENIL 200 MG tablet Take 200 mg by mouth 2 (two) times daily with a meal. 02/22/19   [provider]  predniSONE (DELTASONE) 5 MG tablet Take 5 mg by mouth See admin instructions. To take as directed/as needed for flare ups; 30mg  on first, 20mg  on second, 10mg  on third, and 5mg  on fourth 05/23/19   [provider]  terbinafine (LAMISIL) 250 MG tablet Take 250 mg by mouth daily. 01/05/21   [provider]    Allergies    Morphine and related and Penicillins  Review of Systems   Review of Systems  Constitutional: Negative for fever.  HENT: Negative for sore throat.   Eyes: Negative for visual disturbance.  Respiratory: Negative for cough and shortness of breath.   Cardiovascular: Negative for chest pain.  Gastrointestinal: Positive for abdominal pain. Negative for constipation, diarrhea, hematemesis, hematochezia, nausea and vomiting.  Genitourinary: Positive for vaginal bleeding. Negative for dysuria,  hematuria and vaginal discharge.  Musculoskeletal: Positive for back pain.  Skin: Negative for rash.  Neurological: Negative for headaches.    Physical Exam Updated Vital Signs BP (!) 126/94 (BP Location: Left Arm)   Pulse 69   Temp 98.6 F (37 C) (Oral)   Resp 20   Ht 5\' 6"  (1.676 m)   Wt 120.2 kg   SpO2 100%   BMI 42.77 kg/m  Physical Exam Vitals and nursing note reviewed.  Constitutional:      General: She is not in acute distress.    Appearance: Normal appearance. She is well-developed.  HENT:     Head: Normocephalic and atraumatic.  Eyes:     Conjunctiva/sclera: Conjunctivae normal.  Cardiovascular:     Rate and Rhythm: Normal rate and regular rhythm.     Heart sounds: No murmur heard.   Pulmonary:     Effort: Pulmonary effort is normal. No respiratory distress.     Breath sounds: Normal breath sounds.  Abdominal:     Palpations: Abdomen is soft.     Tenderness: There is no abdominal tenderness. There is no guarding or rebound.  Musculoskeletal:        General: No deformity or signs of injury. Normal range of motion.     Cervical back: Neck supple.  Skin:    General: Skin is warm and dry.  Neurological:     General: No focal deficit present.     Mental Status: She is alert and oriented to person, place, and time.     ED Results / Procedures / Treatments   Labs (all labs ordered are listed, but only abnormal results are displayed) Labs Reviewed  BASIC METABOLIC PANEL - Abnormal; Notable for the following components:      Result Value   Glucose, Bld 104 (*)    Creatinine, Ser 1.02 (*)    Calcium 8.7 (*)    All other components within normal limits  URINALYSIS, ROUTINE W REFLEX MICROSCOPIC - Abnormal; Notable for the following components:   Color, Urine STRAW (*)    Hgb urine dipstick SMALL (*)    Bacteria, UA MANY (*)    All other components within normal limits  CBC WITH DIFFERENTIAL/PLATELET  PREGNANCY, URINE    EKG None  Radiology CT  Abdomen Pelvis W Contrast  Result Date: 01/17/2021 CLINICAL DATA:  Onset lower abdominal and low back pain yesterday. History of kidney stones. EXAM: CT ABDOMEN AND PELVIS WITH CONTRAST TECHNIQUE: Multidetector CT imaging of the abdomen and pelvis was performed using the standard protocol following bolus administration of intravenous contrast. CONTRAST:  100 mL OMNIPAQUE IOHEXOL 300 MG/ML  SOLN COMPARISON:  CT abdomen and pelvis 05/29/2019. FINDINGS: Lower chest: Lung bases clear. 0.4 cm subpleural lymph node in the right lung base on image 5 is unchanged. No pleural or pericardial effusion. Hepatobiliary: No focal liver abnormality is seen. Status post cholecystectomy. No biliary dilatation. Pancreas: Unremarkable. No pancreatic ductal dilatation or surrounding inflammatory changes. Spleen: Normal in size without focal abnormality. Adrenals/Urinary Tract: Adrenal glands are unremarkable. Kidneys are normal, without renal calculi, focal lesion, or hydronephrosis. Bladder is unremarkable. Stomach/Bowel: Stomach is within normal limits. Appendix appears normal. No evidence of bowel wall thickening, distention, or inflammatory changes. Vascular/Lymphatic: No significant vascular findings are present. No enlarged abdominal or pelvic lymph nodes. Reproductive: Uterus and bilateral adnexa are unremarkable. Other: None. Musculoskeletal: No acute or focal abnormality. Lumbar spondylosis appears worst at L3-4 and unchanged. IMPRESSION: No acute abnormality or finding to explain the patient's symptoms. Electronically Signed   By: Drusilla Kanner M.D.   On: 01/17/2021 14:00   US PELVIC COMPLETE W TRANSVAGINAL AND TORSION R/O  Result Date: 01/17/2021 CLINICAL DATA:  40 year old female with sudden onset pelvic pain for 1 day. EXAM: TRANSABDOMINAL AND TRANSVAGINAL ULTRASOUND OF PELVIS DOPPLER ULTRASOUND OF OVARIES TECHNIQUE: Both transabdominal and transvaginal ultrasound examinations of the pelvis were performed.  Transabdominal technique was performed for global  imaging of the pelvis including uterus, ovaries, adnexal regions, and pelvic cul-de-sac. It was necessary to proceed with endovaginal exam following the transabdominal exam to visualize the ovaries. Color and duplex Doppler ultrasound was utilized to evaluate blood flow to the ovaries. COMPARISON:  CT Abdomen and Pelvis 10/21/2016. pelvis ultrasound 08/08/2020 FINDINGS: Uterus Measurements: 7.5 x 4.7 x 4.0 cm = volu 79 me: 5 mm mL. Small fundal fibroid redemonstrated, proximally 2.2 cm (series 1, image 39). Myometrium elsewhere within normal limits. Endometrium Thickness: 5 mm.  No focal abnormality visualized. Right ovary Measurements: 3.3 x 2.0 x 2.6 cm = volume: 6 mL. Normal appearance/no adnexal mass. Left ovary Measurements: 2.6 x 2.1 x 1.8 cm = volume: 9 mL. Small oval 2.6 cm hypoechoic cyst (image 85), no vascular elements (image 89). Pulsed Doppler evaluation of both ovaries demonstrates normal low-resistance arterial and venous waveforms. Other findings No free fluid. IMPRESSION: 1. No evidence of ovarian torsion. Physiologic appearance of both ovaries. 2. Small chronic fundal fibroid. Electronically Signed   By: Odessa Fleming M.D.   On: 01/17/2021 11:45    Procedures Procedures   Medications Ordered in ED Medications  HYDROmorphone (DILAUDID) injection 1 mg (1 mg Intravenous Given 01/17/21 0906)  sodium chloride 0.9 % bolus 1,000 mL (0 mLs Intravenous Stopped 01/17/21 1107)  HYDROmorphone (DILAUDID) injection 1 mg (1 mg Intravenous Given 01/17/21 1133)  iohexol (OMNIPAQUE) 300 MG/ML solution 100 mL (100 mLs Intravenous Contrast Given 01/17/21 1322)  HYDROmorphone (DILAUDID) injection 1 mg (1 mg Intravenous Given 01/17/21 1359)  ondansetron (ZOFRAN) injection 4 mg (4 mg Intravenous Given 01/17/21 1455)    ED Course  I have reviewed the triage vital signs and the nursing notes.  Pertinent labs & imaging results that were available during my care of the  patient were reviewed by me and considered in my medical decision making (see chart for details).  Clinical Course as of 01/17/21 1709  Fri Jan 17, 2021  1416 Reviewed results of work-up with patient.  She understands the neck step would be to see her gynecologist for further work-up.  She is comfortable with plan for trial of some antibiotics in case the urine is a possible source although its not very impressive. [MB]    Clinical Course User Index [MB] Terrilee Files, MD   MDM Rules/Calculators/A&P                         This patient complains of lower abdominal pain; this involves an extensive number of treatment Options and is a complaint that carries with it a high risk of complications and Morbidity. The differential includes torsion, ovarian cyst, appendicitis, diverticulitis, less likely PID due to monogamous same-sex relationship, UTI, endometriosis  I ordered, reviewed and interpreted labs, which included CBC with normal white count normal hemoglobin, chemistries normal, urinalysis without obvious signs of infection, pregnancy test negative I ordered medication IV fluids IV pain medication and nausea medication I ordered imaging studies which included pelvic ultrasound and CT abdomen and pelvis and I independently    visualized and interpreted imaging which showed no acute findings Additional history obtained from patient significant other Previous records obtained and reviewed in epic, had a prior work-up of similar presentation without clear etiology identified  After the interventions stated above, I reevaluated the patient and found patient's pain to be better although not completely resolved.  Reviewed results with her.  She is comfortable plan for outpatient follow-up with her primary care and  GYN providers.  Return instructions discussed   Final Clinical Impression(s) / ED Diagnoses Final diagnoses:  Lower abdominal pain    Rx / DC Orders ED Discharge Orders          Ordered    cephALEXin (KEFLEX) 500 MG capsule  3 times daily        01/17/21 1418    ondansetron (ZOFRAN ODT) 4 MG disintegrating tablet  Every 8 hours PRN        01/17/21 1452           Terrilee FilesButler, Aishah Teffeteller C, MD 01/17/21 1711

## 2021-01-17 NOTE — ED Triage Notes (Signed)
Pt presents to ED with complaints of lower abdominal pain into lower back since yesterday. Denies N/V/D and fever. States she doesn't feel like urine is normal but its not discolored.

## 2021-02-10 ENCOUNTER — Emergency Department (HOSPITAL_BASED_OUTPATIENT_CLINIC_OR_DEPARTMENT_OTHER): Payer: Commercial Managed Care - PPO

## 2021-02-10 ENCOUNTER — Other Ambulatory Visit: Payer: Self-pay

## 2021-02-10 ENCOUNTER — Encounter (HOSPITAL_COMMUNITY): Payer: Self-pay

## 2021-02-10 ENCOUNTER — Emergency Department (HOSPITAL_COMMUNITY)
Admission: EM | Admit: 2021-02-10 | Discharge: 2021-02-10 | Disposition: A | Discharge status: Home / Self Care | Payer: Commercial Managed Care - PPO | Attending: Emergency Medicine | Admitting: Emergency Medicine

## 2021-02-10 DIAGNOSIS — M79662 Pain in left lower leg: Secondary | ICD-10-CM

## 2021-02-10 DIAGNOSIS — Z86711 Personal history of pulmonary embolism: Secondary | ICD-10-CM | POA: Diagnosis not present

## 2021-02-10 DIAGNOSIS — Z79899 Other long term (current) drug therapy: Secondary | ICD-10-CM | POA: Diagnosis not present

## 2021-02-10 DIAGNOSIS — Z87891 Personal history of nicotine dependence: Secondary | ICD-10-CM | POA: Diagnosis not present

## 2021-02-10 DIAGNOSIS — Z86718 Personal history of other venous thrombosis and embolism: Secondary | ICD-10-CM | POA: Diagnosis not present

## 2021-02-10 DIAGNOSIS — E039 Hypothyroidism, unspecified: Secondary | ICD-10-CM | POA: Insufficient documentation

## 2021-02-10 DIAGNOSIS — M79605 Pain in left leg: Secondary | ICD-10-CM

## 2021-02-10 NOTE — Progress Notes (Signed)
VASCULAR LAB    Left lower extremity venous duplex has been performed.  See CV proc for preliminary results.  Messaged results to Dr. Wilkie Aye via secure chat  Rosezetta Schlatter, [facility], RVT 02/10/2021, 9:20 AM

## 2021-02-10 NOTE — ED Triage Notes (Signed)
Pt BIB [facility] from surgical center. They noted pain to Left leg behind her knee. No hx of the same. Surgeon wanted patient sent here for further evaluation to r/o blood clot    BP 124/85 HR 64 RR 16 100% RA 97.7

## 2021-02-10 NOTE — ED Provider Notes (Signed)
[facility] EMERGENCY DEPARTMENT Provider Note   CSN: [id number] Arrival date & time: 02/10/21  0747     History Chief Complaint  Patient presents with  . Leg Pain    Katrina Osborne is a 40 y.o. female.  HPI   40 year old female with past medical history of lupus and antiphospholipid syndrome anticoagulated on Eliquis presents the emergency department concern for left lower extremity pain behind her knee.  Patient was scheduled to have a hysterectomy this morning, she had stopped her Eliquis on Friday at the request of surgery.  She states later in the day Saturday she started having some swelling and pain behind her left knee.  Denies any leg discoloration.  Adamantly denies any chest pain, shortness of breath, cough.  No recent fever or illness or injury.  Past Medical History:  Diagnosis Date  . APS (antiphospholipid syndrome) (HCC)   . DVT (deep venous thrombosis) (HCC)   . Hashimoto's disease   . Hashimoto's disease 09/22/2015  . History of herniated intervertebral disc   . Kidney stone   . Lupus (HCC)   . Ovarian torsion   . Saddle embolism of pulmonary artery (HCC)   . Thyroid disease     Patient Active Problem List   Diagnosis Date Noted  . DVT (deep venous thrombosis) (HCC) 06/20/2019    Past Surgical History:  Procedure Laterality Date  . ABLATION ON ENDOMETRIOSIS    . BACK SURGERY    . CHOLECYSTECTOMY    . NECK SURGERY     c4-c7  . OVARY SURGERY    . TONSILLECTOMY       OB History    Gravida  1   Para      Term      Preterm      AB      Living        SAB      IAB      Ectopic      Multiple      Live Births              Family History  Problem Relation Age of Onset  . Thyroid disease Mother   . Pulmonary embolism Father     Social History   Tobacco Use  . Smoking status: Former Smoker    Quit date: 09/15/2012    Years since quitting: 8.4  . Smokeless tobacco: Never Used  Vaping Use  . Vaping Use:  Never used  Substance Use Topics  . Alcohol use: No  . Drug use: Never    Home Medications Prior to Admission medications   Medication Sig Start Date End Date Taking? Authorizing Provider  albuterol (VENTOLIN HFA) 108 (90 Base) MCG/ACT inhaler Inhale 1-2 puffs into the lungs every 6 (six) hours as needed for wheezing or shortness of breath.     [provider]  ALPRAZolam Prudy Feeler) 0.5 MG tablet Take 0.5 mg by mouth at bedtime.  03/28/19   [provider]  apixaban (ELIQUIS) 5 MG TABS tablet Take 5 mg by mouth 2 (two) times daily.    [provider]  Ascorbic Acid (VITAMIN C GUMMIE PO) Take 1 tablet by mouth daily.    [provider]  cephALEXin (KEFLEX) 500 MG capsule Take 1 capsule (500 mg total) by mouth 3 (three) times daily. 01/17/21   Terrilee Files, MD  cyclobenzaprine (FLEXERIL) 5 MG tablet Take 5 mg by mouth daily as needed for muscle spasms.    [provider]  escitalopram (LEXAPRO) 5 MG tablet Take 5 mg by mouth daily. 10/28/19   [provider]  fluticasone (FLONASE) 50 MCG/ACT nasal spray Place 2 sprays into the nose daily as needed for allergies.     [provider]  HYDROcodone-acetaminophen (NORCO/VICODIN) 5-325 MG tablet Take 1 tablet by mouth every 6 (six) hours as needed for moderate pain.    [provider]  levothyroxine (SYNTHROID) 50 MCG tablet Take 50 mcg by mouth every morning. 09/07/19   [provider]  naloxone [facility]) nasal spray 4 mg/0.1 mL Administer a single spray intranasally into one nostril.  Call 911.  May repeat x1 in 2 to 3 minutes using a new nasal spray. 02/23/17   [provider]  omeprazole (PRILOSEC) 40 MG capsule Take 40 mg by mouth every morning.  01/27/19   [provider]  ondansetron (ZOFRAN ODT) 4 MG disintegrating tablet Take 1 tablet (4 mg total) by mouth every 8 (eight) hours as needed for nausea or vomiting. 01/17/21   Terrilee Files, MD   ondansetron (ZOFRAN) 4 MG tablet Take 4 mg by mouth every 8 (eight) hours as needed for nausea or vomiting.  02/22/19   [provider]  oxyCODONE-acetaminophen (PERCOCET/ROXICET) 5-325 MG tablet Take 1 tablet by mouth 2 (two) times daily as needed for severe pain.    [provider]  PLAQUENIL 200 MG tablet Take 200 mg by mouth 2 (two) times daily with a meal. 02/22/19   [provider]  predniSONE (DELTASONE) 5 MG tablet Take 5 mg by mouth See admin instructions. To take as directed/as needed for flare ups; 30mg  on first, 20mg  on second, 10mg  on third, and 5mg  on fourth Patient not taking: Reported on 01/17/2021 05/23/19   [provider]  terbinafine (LAMISIL) 250 MG tablet Take 250 mg by mouth daily. Patient not taking: Reported on 01/17/2021 01/05/21   [provider]    Allergies    Morphine and related and Penicillins  Review of Systems   Review of Systems  Constitutional: Negative for chills and fever.  HENT: Negative for congestion.   Respiratory: Negative for chest tightness and shortness of breath.   Cardiovascular: Negative for chest pain and palpitations.  Gastrointestinal: Negative for abdominal pain, diarrhea and vomiting.  Genitourinary: Negative for dysuria.  Musculoskeletal:       +LLE knee pain/swelling  Neurological: Negative for headaches.    Physical Exam Updated Vital Signs BP 112/76   Pulse 93   Temp 98.4 F (36.9 C) (Oral)   Resp 18   Ht 5\' 6"  (1.676 m)   Wt 122 kg   SpO2 100%   BMI 43.42 kg/m   Physical Exam Vitals and nursing note reviewed.  Constitutional:      Appearance: Normal appearance.  HENT:     Head: Normocephalic.     Mouth/Throat:     Mouth: Mucous membranes are moist.  Cardiovascular:     Rate and Rhythm: Normal rate.  Pulmonary:     Effort: Pulmonary effort is normal. No respiratory distress.  Abdominal:     Palpations: Abdomen is soft.     Tenderness: There is no abdominal tenderness.   Musculoskeletal:     Comments: Mild TTP behind left knee, no obvious deformity or objective swelling  Skin:    General: Skin is warm.  Neurological:     Mental Status: She is alert and oriented to person, place, and time. Mental status is at baseline.  Psychiatric:  Mood and Affect: Mood normal.     ED Results / Procedures / Treatments   Labs (all labs ordered are listed, but only abnormal results are displayed) Labs Reviewed - No data to display  EKG None  Radiology No results found.  Procedures Procedures   Medications Ordered in ED Medications - No data to display  ED Course  I have reviewed the triage vital signs and the nursing notes.  Pertinent labs & imaging results that were available during my care of the patient were reviewed by me and considered in my medical decision making (see chart for details).    MDM Rules/Calculators/A&P                          40 year old female presents the emergency department with discomfort behind the left knee.  She has been off her Eliquis for the past 2 days due to a scheduled hysterectomy today.  Vitals are normal and stable.  Patient denies any chest pain, shortness of breath, symptoms of PE.  Left lower extremity objectively looks unremarkable, slight discomfort when palpating the popliteal fossa.  Ultrasound shows no DVT.  Discussed with the patient to call her surgeon primary doctor to come up with a plan for Eliquis resumption and further evaluation.  Patient will be discharged and treated as an outpatient.  Discharge plan and strict return to ED precautions discussed, patient verbalizes understanding and agreement.  Final Clinical Impression(s) / ED Diagnoses Final diagnoses:  None    Rx / DC Orders ED Discharge Orders    None       Rozelle Logan, DO 02/10/21 1021

## 2021-02-10 NOTE — ED Triage Notes (Signed)
Pt reports she was scheduled to have a complete hysterectomy today. She quit taking her Eliquis on Friday (20) and noticed pain and swelling behind her Left knee Saturday (21st)

## 2021-02-10 NOTE — ED Notes (Signed)
Patient transported to Ultrasound 

## 2021-02-10 NOTE — Discharge Instructions (Addendum)
You have been seen and discharged from the emergency department.  Your ultrasound results do not demonstrate a DVT.  Follow-up with your surgeon for further recommendations.  Follow-up with your primary provider for reevaluation and further care. Take home medications as prescribed. If you have any worsening symptoms or further concerns for your health please return to an emergency department for further evaluation.

## 2021-05-07 ENCOUNTER — Emergency Department (HOSPITAL_COMMUNITY)
Admission: EM | Admit: 2021-05-07 | Discharge: 2021-05-07 | Disposition: A | Discharge status: Home / Self Care | Payer: Commercial Managed Care - PPO | Attending: Emergency Medicine | Admitting: Emergency Medicine

## 2021-05-07 ENCOUNTER — Emergency Department (HOSPITAL_COMMUNITY): Payer: Commercial Managed Care - PPO

## 2021-05-07 ENCOUNTER — Encounter (HOSPITAL_COMMUNITY): Payer: Self-pay

## 2021-05-07 ENCOUNTER — Other Ambulatory Visit: Payer: Self-pay

## 2021-05-07 DIAGNOSIS — Z7901 Long term (current) use of anticoagulants: Secondary | ICD-10-CM | POA: Diagnosis not present

## 2021-05-07 DIAGNOSIS — S161XXA Strain of muscle, fascia and tendon at neck level, initial encounter: Secondary | ICD-10-CM | POA: Diagnosis not present

## 2021-05-07 DIAGNOSIS — Z87891 Personal history of nicotine dependence: Secondary | ICD-10-CM | POA: Diagnosis not present

## 2021-05-07 DIAGNOSIS — S060X0A Concussion without loss of consciousness, initial encounter: Secondary | ICD-10-CM | POA: Insufficient documentation

## 2021-05-07 DIAGNOSIS — W228XXA Striking against or struck by other objects, initial encounter: Secondary | ICD-10-CM | POA: Diagnosis not present

## 2021-05-07 DIAGNOSIS — M542 Cervicalgia: Secondary | ICD-10-CM

## 2021-05-07 DIAGNOSIS — S0990XA Unspecified injury of head, initial encounter: Secondary | ICD-10-CM | POA: Diagnosis present

## 2021-05-07 MED ORDER — IOHEXOL 350 MG/ML SOLN
75.0000 mL | Freq: Once | INTRAVENOUS | Status: AC | PRN
  Administered 2021-05-07: 75 mL via INTRAVENOUS

## 2021-05-07 NOTE — ED Provider Notes (Signed)
[facility] EMERGENCY DEPARTMENT Provider Note   CSN: [id number] Arrival date & time: 05/07/21  4098     History No chief complaint on file.   Katrina Osborne is a 40 y.o. female.  HPI Patient presents after hitting her head.  States that she was getting up and went to tied around a door and hit her head hard on the door frame.  Balance has been off and has a bad headache.  Also some upper neck pain.  Patient states that she is on Eliquis.  Has had previous pulmonary embolisms a history of DVT and antiphospholipid syndrome.  Also lupus.     Past Medical History:  Diagnosis Date   APS (antiphospholipid syndrome) (HCC)    DVT (deep venous thrombosis) (HCC)    Hashimoto's disease    Hashimoto's disease 09/22/2015   History of herniated intervertebral disc    Kidney stone    Lupus (HCC)    Ovarian torsion    Saddle embolism of pulmonary artery (HCC)    Thyroid disease     Patient Active Problem List   Diagnosis Date Noted   DVT (deep venous thrombosis) (HCC) 06/20/2019    Past Surgical History:  Procedure Laterality Date   ABLATION ON ENDOMETRIOSIS     BACK SURGERY     CHOLECYSTECTOMY     NECK SURGERY     c4-c7   OVARY SURGERY     TONSILLECTOMY       OB History     Gravida  1   Para      Term      Preterm      AB      Living         SAB      IAB      Ectopic      Multiple      Live Births              Family History  Problem Relation Age of Onset   Thyroid disease Mother    Pulmonary embolism Father     Social History   Tobacco Use   Smoking status: Former    Types: Cigarettes    Quit date: 09/15/2012    Years since quitting: 8.6   Smokeless tobacco: Never  Vaping Use   Vaping Use: Never used  Substance Use Topics   Alcohol use: No   Drug use: Never    Home Medications Prior to Admission medications   Medication Sig Start Date End Date Taking? Authorizing Provider  ALPRAZolam Prudy Feeler) 0.5 MG tablet Take 0.5 mg by mouth  at bedtime.  03/28/19  Yes [provider]  apixaban (ELIQUIS) 5 MG TABS tablet Take 5 mg by mouth 2 (two) times daily.   Yes [provider]  Ascorbic Acid (VITAMIN C GUMMIE PO) Take 1 tablet by mouth daily.   Yes [provider]  cyclobenzaprine (FLEXERIL) 5 MG tablet Take 5 mg by mouth daily as needed for muscle spasms.   Yes [provider]  escitalopram (LEXAPRO) 5 MG tablet Take 5 mg by mouth daily. 10/28/19  Yes [provider]  fluticasone (FLONASE) 50 MCG/ACT nasal spray Place 2 sprays into the nose daily as needed for allergies.    Yes [provider]  HYDROcodone-acetaminophen (NORCO/VICODIN) 5-325 MG tablet Take 1 tablet by mouth every 6 (six) hours as needed for moderate pain.   Yes [provider]  hydrOXYzine (ATARAX/VISTARIL) 50 MG tablet Take 50 mg by mouth at  bedtime. 04/30/21  Yes [provider]  levothyroxine (SYNTHROID) 50 MCG tablet Take 50 mcg by mouth every morning. 09/07/19  Yes [provider]  omeprazole (PRILOSEC) 40 MG capsule Take 40 mg by mouth every morning.  01/27/19  Yes [provider]  ondansetron (ZOFRAN ODT) 4 MG disintegrating tablet Take 1 tablet (4 mg total) by mouth every 8 (eight) hours as needed for nausea or vomiting. 01/17/21  Yes Terrilee Files, MD  oxyCODONE-acetaminophen (PERCOCET/ROXICET) 5-325 MG tablet Take 1 tablet by mouth 2 (two) times daily as needed for severe pain.   Yes [provider]  PLAQUENIL 200 MG tablet Take 200 mg by mouth 2 (two) times daily with a meal. 02/22/19  Yes [provider]  cephALEXin (KEFLEX) 500 MG capsule Take 1 capsule (500 mg total) by mouth 3 (three) times daily. Patient not taking: No sig reported 01/17/21   Terrilee Files, MD  naloxone [facility]) nasal spray 4 mg/0.1 mL Administer a single spray intranasally into one nostril.  Call 911.  May repeat x1 in 2 to 3 minutes using a new nasal spray. 02/23/17   [provider]    Allergies    Morphine and related, Penicillins, and Reglan [metoclopramide]  Review of Systems   Review of Systems  Constitutional:  Negative for appetite change.  HENT:  Negative for congestion.   Respiratory:  Negative for shortness of breath.   Cardiovascular:  Negative for chest pain.  Gastrointestinal:  Positive for nausea.  Genitourinary:  Negative for flank pain.  Musculoskeletal:  Positive for neck pain. Negative for back pain.  Skin:  Negative for rash.  Neurological:  Positive for dizziness and headaches.  Psychiatric/Behavioral:  Negative for confusion.    Physical Exam Updated Vital Signs BP (!) 141/83   Pulse (!) 52   Temp 98.2 F (36.8 C)   Resp 20   Ht 5\' 6"  (1.676 m)   Wt 122.5 kg   LMP 04/09/2021 (Exact Date)   SpO2 100%   BMI 43.58 kg/m   Physical Exam Vitals and nursing note reviewed.  HENT:     Head: Normocephalic.     Comments: Sitting in bed with eyes held closed.    Right Ear: Tympanic membrane normal.     Left Ear: Tympanic membrane normal.     Mouth/Throat:     Mouth: Mucous membranes are moist.  Eyes:     Extraocular Movements: Extraocular movements intact.     Pupils: Pupils are equal, round, and reactive to light.  Neck:     Comments: Tenderness to upper cervical spine. Cardiovascular:     Rate and Rhythm: Regular rhythm.  Pulmonary:     Breath sounds: No stridor.  Abdominal:     Tenderness: There is no abdominal tenderness.  Musculoskeletal:        General: No tenderness.  Skin:    General: Skin is warm.     Capillary Refill: Capillary refill takes less than 2 seconds.  Neurological:     Mental Status: She is alert and oriented to person, place, and time.    ED Results / Procedures / Treatments   Labs (all labs ordered are listed, but only abnormal results are displayed) Labs Reviewed - No data to display  EKG None  Radiology CT ANGIO HEAD NECK W WO CM  Result Date: 05/07/2021 CLINICAL DATA:  Head  trauma EXAM: CT ANGIOGRAPHY HEAD AND NECK TECHNIQUE: Multidetector CT imaging of the head and neck was performed using  the standard protocol during bolus administration of intravenous contrast. Multiplanar CT image reconstructions and MIPs were obtained to evaluate the vascular anatomy. Carotid stenosis measurements (when applicable) are obtained utilizing NASCET criteria, using the distal internal carotid diameter as the denominator. CONTRAST:  13mL OMNIPAQUE IOHEXOL 350 MG/ML SOLN COMPARISON:  Same-day noncontrast CT head FINDINGS: CTA NECK FINDINGS Aortic arch: Standard branching. Imaged portion shows no evidence of aneurysm or dissection. No significant stenosis of the major arch vessel origins. Right carotid system: No evidence of dissection, stenosis (50% or greater) or occlusion. Left carotid system: No evidence of dissection, stenosis (50% or greater) or occlusion. Vertebral arteries: Codominant. No evidence of dissection, stenosis (50% or greater) or occlusion. Skeleton: The patient is status post C4 through C7 ACDF. There is no acute osseous abnormality. Other neck: There is a 2.2 cm by 0.7 cm lesion along the buccal margin of the right hemi mandible with internal locules of air. The soft tissues are otherwise unremarkable. Upper chest: The lung apices are clear. Review of the MIP images confirms the above findings CTA HEAD FINDINGS Anterior circulation: The bilateral cavernous ICAs are patent, with no hemodynamically significant stenosis, occlusion, or dissection. The bilateral MCAs and ACAs are patent. No aneurysm is identified. Posterior circulation: The V4 segments of the vertebral arteries are patent. The basilar artery is patent. The bilateral PCAs are patent. Venous sinuses: As permitted by contrast timing, patent. Anatomic variants: There is a fetal origin of the right PCA. Review of the MIP images confirms the above findings IMPRESSION: 1. Normal vasculature of the head and neck without evidence  of traumatic injury, significant stenosis, occlusion, or dissection. 2. 2.2 cm lesion with internal locules of air along the buccal margin of the right hemi mandible may reflect chewing gum. Recommend direct visualization. Electronically Signed   By: Lesia Hausen M.D.   On: 05/07/2021 10:27   CT HEAD WO CONTRAST ( )  Result Date: 05/07/2021 CLINICAL DATA:  Severe headache and dizziness after hitting forehead on a door frame. EXAM: CT HEAD WITHOUT CONTRAST CT CERVICAL SPINE WITHOUT CONTRAST TECHNIQUE: Multidetector CT imaging of the head and cervical spine was performed following the standard protocol without intravenous contrast. Multiplanar CT image reconstructions of the cervical spine were also generated. COMPARISON:  None. FINDINGS: CT HEAD FINDINGS Brain: No evidence of acute infarction, hemorrhage, hydrocephalus, extra-axial collection or mass lesion/mass effect. Vascular: No hyperdense vessel or unexpected calcification. Skull: Normal. Negative for fracture or focal lesion. Sinuses/Orbits: No acute finding. Other: None. CT CERVICAL SPINE FINDINGS Alignment: No traumatic malalignment. Skull base and vertebrae: No acute fracture. No primary bone lesion or focal pathologic process. Soft tissues and spinal canal: No prevertebral fluid or swelling. No visible canal hematoma. Disc levels: Prior C4-C7 ACDF. Residual right greater than left neuroforaminal stenosis at C6-C7 due to uncovertebral hypertrophy. Upper chest: Negative. Other: None. IMPRESSION: 1. Normal noncontrast head CT. 2. No acute cervical spine fracture or traumatic malalignment. 3. Prior C4-C7 ACDF. Residual right greater than left neuroforaminal stenosis at C6-C7 due to uncovertebral hypertrophy. Electronically Signed   By: Obie Dredge M.D.   On: 05/07/2021 10:12   CT C-SPINE NO CHARGE  Result Date: 05/07/2021 CLINICAL DATA:  Severe headache and dizziness after hitting forehead on a door frame. EXAM: CT HEAD WITHOUT CONTRAST CT  CERVICAL SPINE WITHOUT CONTRAST TECHNIQUE: Multidetector CT imaging of the head and cervical spine was performed following the standard protocol without intravenous contrast. Multiplanar CT image reconstructions of the cervical spine were also generated. COMPARISON:  None. FINDINGS: CT HEAD FINDINGS Brain: No evidence of acute infarction, hemorrhage, hydrocephalus, extra-axial collection or mass lesion/mass effect. Vascular: No hyperdense vessel or unexpected calcification. Skull: Normal. Negative for fracture or focal lesion. Sinuses/Orbits: No acute finding. Other: None. CT CERVICAL SPINE FINDINGS Alignment: No traumatic malalignment. Skull base and vertebrae: No acute fracture. No primary bone lesion or focal pathologic process. Soft tissues and spinal canal: No prevertebral fluid or swelling. No visible canal hematoma. Disc levels: Prior C4-C7 ACDF. Residual right greater than left neuroforaminal stenosis at C6-C7 due to uncovertebral hypertrophy. Upper chest: Negative. Other: None. IMPRESSION: 1. Normal noncontrast head CT. 2. No acute cervical spine fracture or traumatic malalignment. 3. Prior C4-C7 ACDF. Residual right greater than left neuroforaminal stenosis at C6-C7 due to uncovertebral hypertrophy. Electronically Signed   By: Obie DredgeWilliam T Derry M.D.   On: 05/07/2021 10:12    Procedures Procedures   Medications Ordered in ED Medications  iohexol (OMNIPAQUE) 350 MG/ML injection 75 mL (75 mLs Intravenous Contrast Given 05/07/21 0951)    ED Course  I have reviewed the triage vital signs and the nursing notes.  Pertinent labs & imaging results that were available during my care of the patient were reviewed by me and considered in my medical decision making (see chart for details).    MDM Rules/Calculators/A&P                           Patient with fall.  Headache and dizziness.  Neck pain.  On anticoagulation.  Has a headache.  Appears to be a concussion.  Head CT done and is reassuring.   However with neck pain and dizziness CT angiography done both of the head and neck and was negative.  No apparent vertebral dissection.  No other apparent injury.  Will discharge home.  Patient has Zofran at home will follow up with PCP as needed. Final Clinical Impression(s) / ED Diagnoses Final diagnoses:  Concussion without loss of consciousness, initial encounter  Cervical strain, acute, initial encounter    Rx / DC Orders ED Discharge Orders     None        Benjiman CorePickering, Treniya Lobb, MD 05/07/21 1556

## 2021-05-07 NOTE — ED Triage Notes (Signed)
Pt hit her head this morning reports of being on blood thinners (eliquis), pt states her balance is now off and has a terrible headache.  She said she has never hit her head this hard.  Pain 7/10

## 2022-06-18 ENCOUNTER — Ambulatory Visit
Admission: RE | Admit: 2022-06-18 | Discharge: 2022-06-18 | Disposition: A | Discharge status: Home / Self Care | Payer: Commercial Managed Care - PPO | Source: Ambulatory Visit | Attending: Family Medicine | Admitting: Family Medicine

## 2022-06-18 ENCOUNTER — Other Ambulatory Visit: Payer: Self-pay | Admitting: Family Medicine

## 2022-06-18 DIAGNOSIS — Z1231 Encounter for screening mammogram for malignant neoplasm of breast: Secondary | ICD-10-CM

## 2022-11-18 IMAGING — CT CT CERVICAL SPINE W/O CM
3 of 4 series · 13 of 33 positions shown, 16 images · IV contrast (Omnipaque or Isovue)
Comparison: None.

CLINICAL DATA: Severe headache and dizziness after hitting forehead
on a door frame.

EXAM:
CT HEAD WITHOUT CONTRAST
CT CERVICAL SPINE WITHOUT CONTRAST
TECHNIQUE: Multidetector CT imaging of the head and cervical spine was
performed following the standard protocol without intravenous
contrast. Multiplanar CT image reconstructions of the cervical spine
were also generated.

[Series 1: c spine axial · axial · 0.29mm/px · z∈[-54,+66]mm · 5 of 92 slices shown, 7 images]
[im 16/92  soft-tissue]
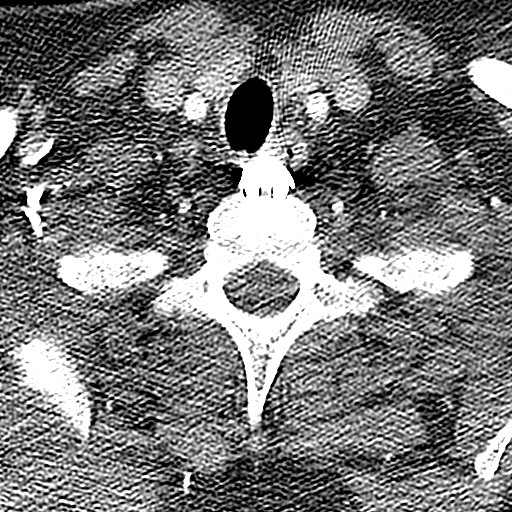
[im 16/92  bone]
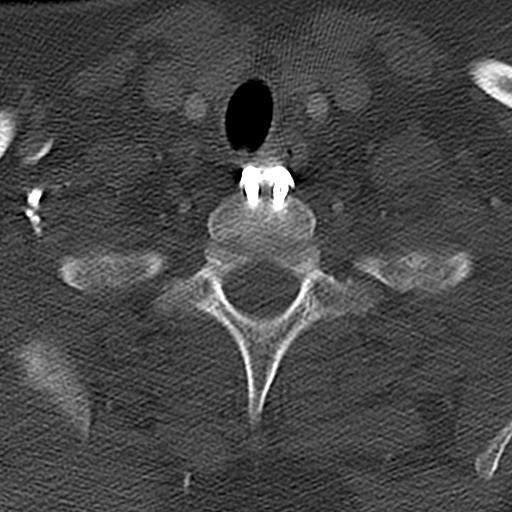
[im 31/92  bone]
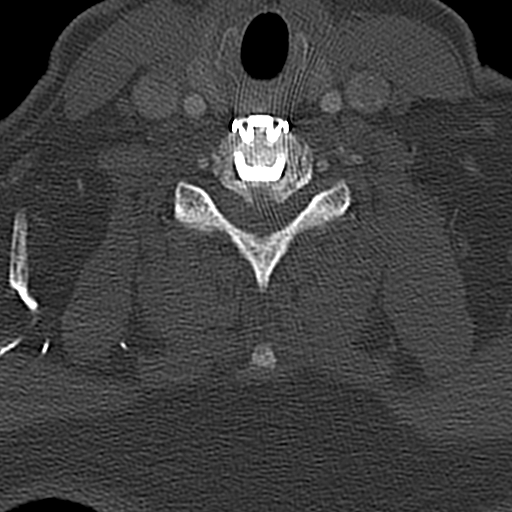
[im 46/92  bone]
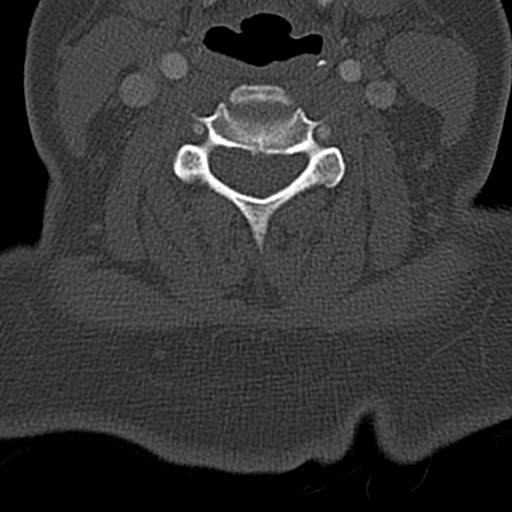
[im 61/92  bone]
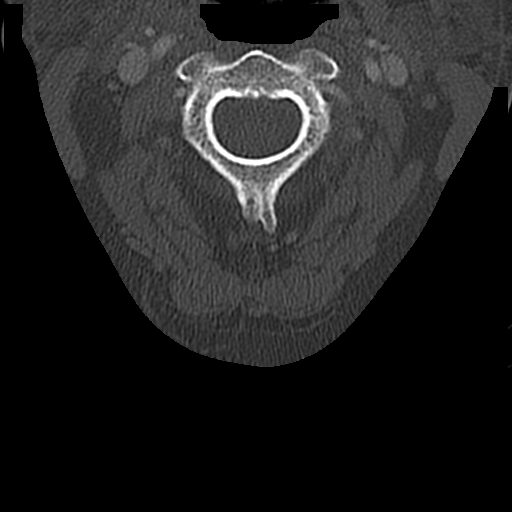
[im 76/92  soft-tissue]
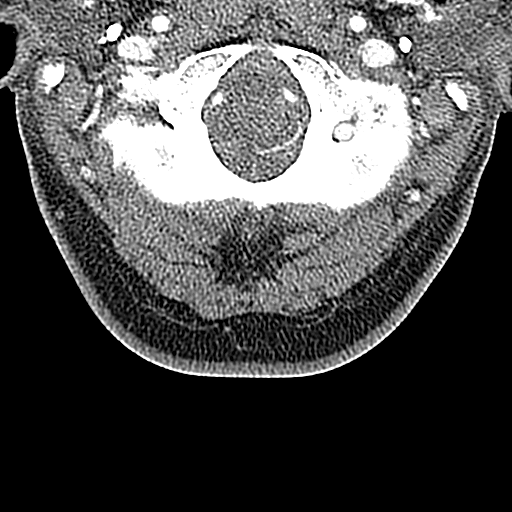
[im 76/92  bone]
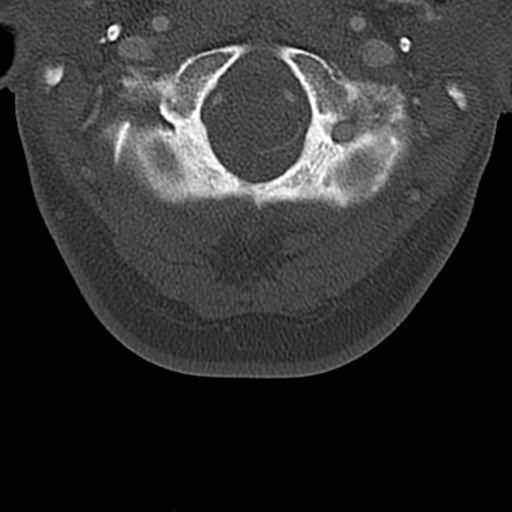

[Series 5: cor c spine sag · sagittal · 0.28mm/px · 5 of 79 slices shown, 6 images]
[im 27/79  bone]
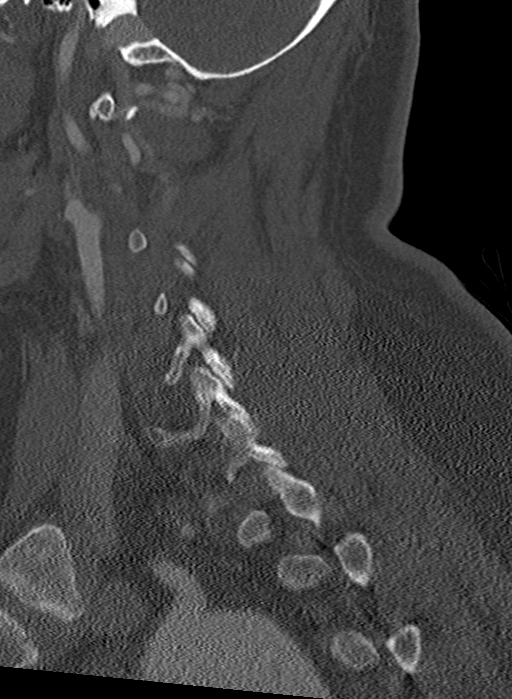
[im 33/79  bone]
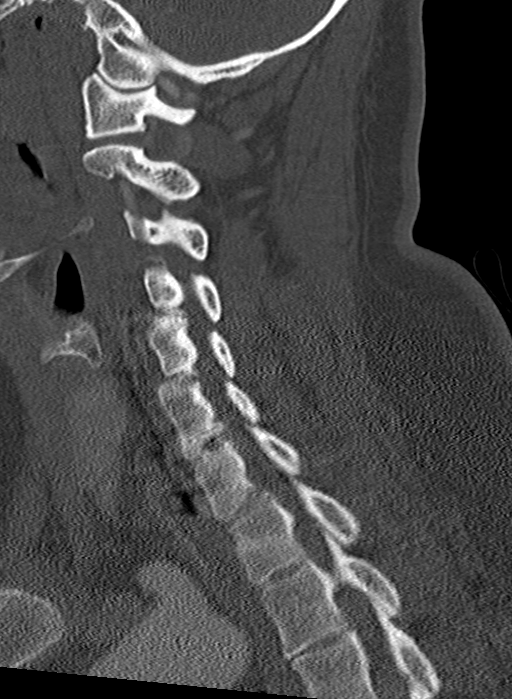
[im 40/79  soft-tissue]
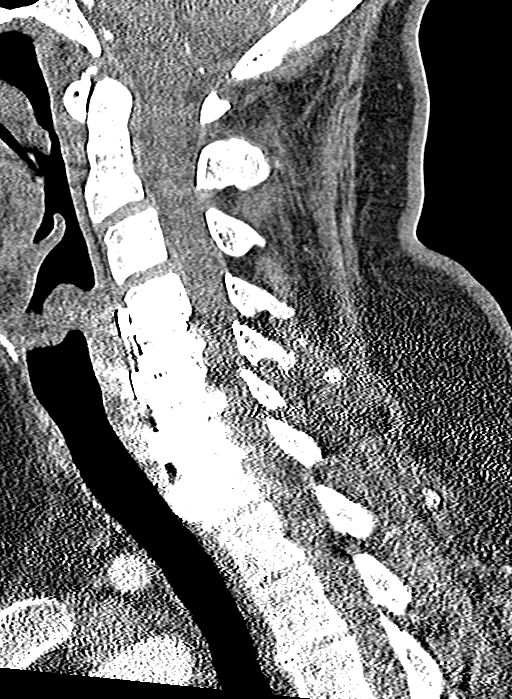
[im 40/79  bone]
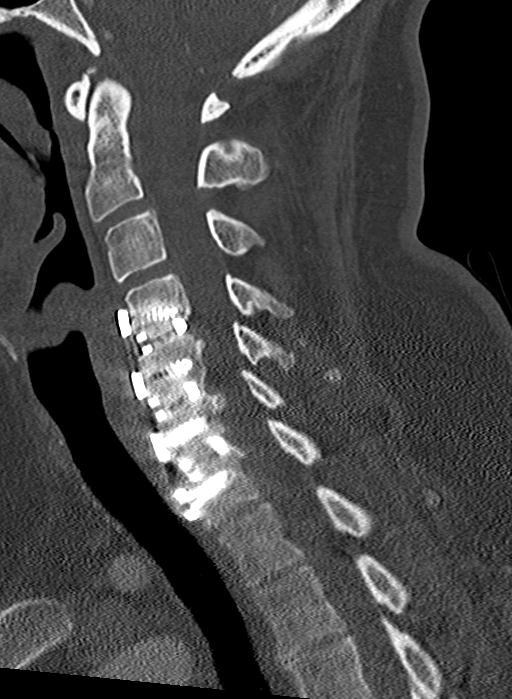
[im 46/79  bone]
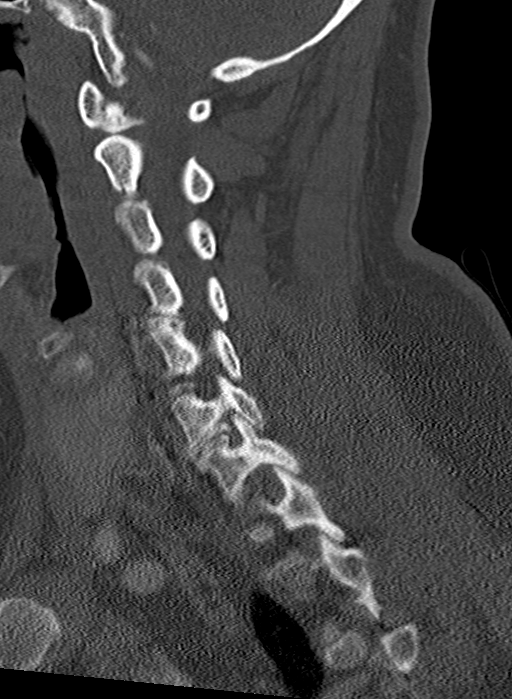
[im 53/79  bone]
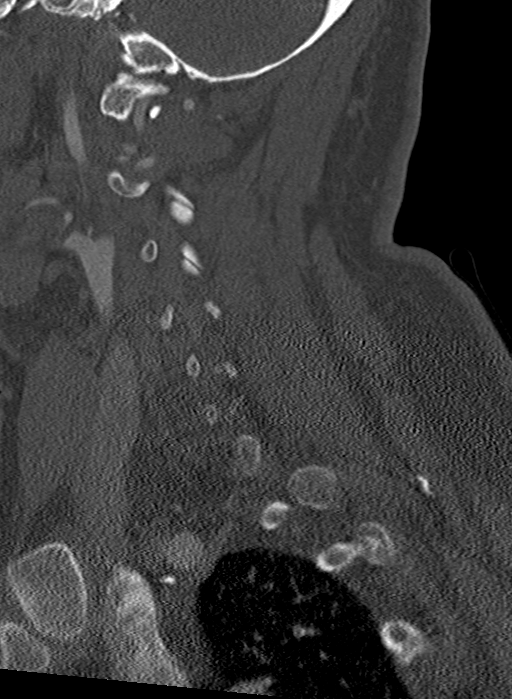

[Series 6: cor c spine cor · coronal · 0.29mm/px · 3 of 70 slices shown]
[im 14/70  bone]
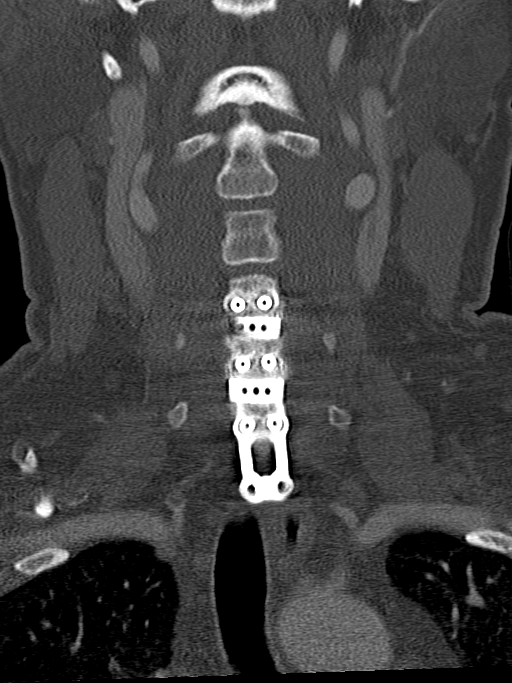
[im 28/70  bone]
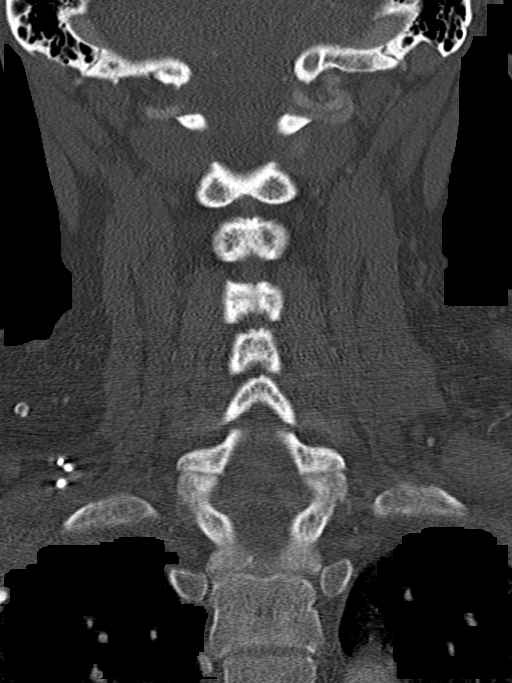
[im 42/70  bone]
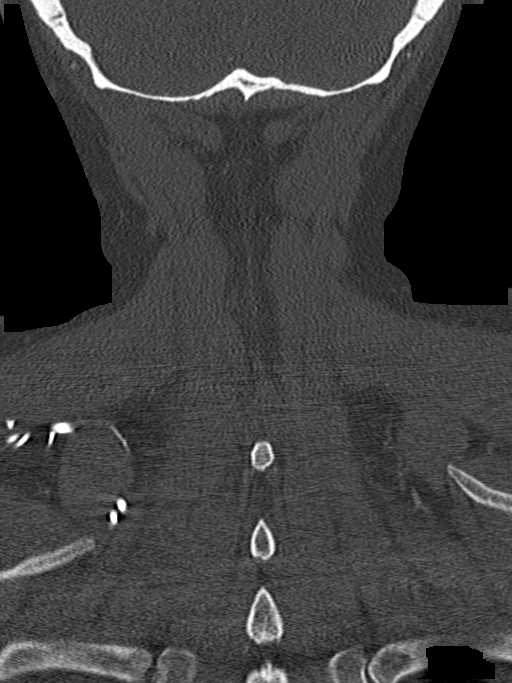

[13 of 33 positions shown; findings below may reference images not displayed]

FINDINGS: CT HEAD FINDINGS

Brain: No evidence of acute infarction, hemorrhage, hydrocephalus,
extra-axial collection or mass lesion/mass effect.

Vascular: No hyperdense vessel or unexpected calcification.

Skull: Normal. Negative for fracture or focal lesion.

Sinuses/Orbits: No acute finding.

Other: None.

CT CERVICAL SPINE FINDINGS

Alignment: No traumatic malalignment.

Skull base and vertebrae: No acute fracture. No primary bone lesion
or focal pathologic process.

Soft tissues and spinal canal: No prevertebral fluid or swelling. No
visible canal hematoma.

Disc levels: Prior C4-C7 ACDF. Residual right greater than left
neuroforaminal stenosis at C6-C7 due to uncovertebral hypertrophy.

Upper chest: Negative.

Other: None.
IMPRESSION: 1. Normal noncontrast head CT.
2. No acute cervical spine fracture or traumatic malalignment.
3. Prior C4-C7 ACDF. Residual right greater than left neuroforaminal
stenosis at C6-C7 due to uncovertebral hypertrophy.

## 2022-11-18 IMAGING — CT CT HEAD W/O CM
3 series · 15 of 47 positions shown, 18 images · non-contrast
Comparison: None.

CLINICAL DATA: Severe headache and dizziness after hitting forehead
on a door frame.

EXAM:
CT HEAD WITHOUT CONTRAST
CT CERVICAL SPINE WITHOUT CONTRAST
TECHNIQUE: Multidetector CT imaging of the head and cervical spine was
performed following the standard protocol without intravenous
contrast. Multiplanar CT image reconstructions of the cervical spine
were also generated.

[Series 2: head w o · axial · 0.41mm/px · z∈[+42,+172]mm · 9 of 32 slices shown, 12 images]
[im 3/32  brain]
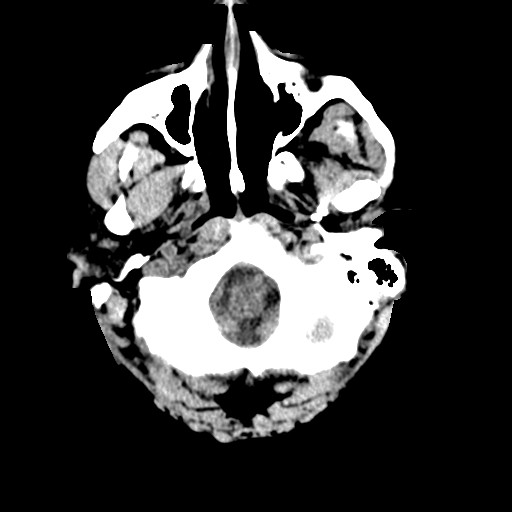
[im 3/32  bone]
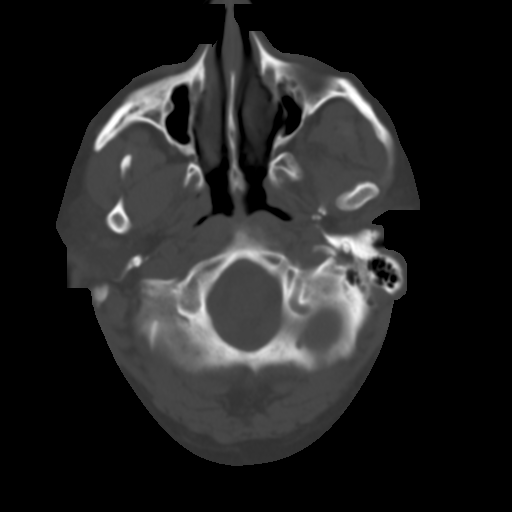
[im 6/32  brain]
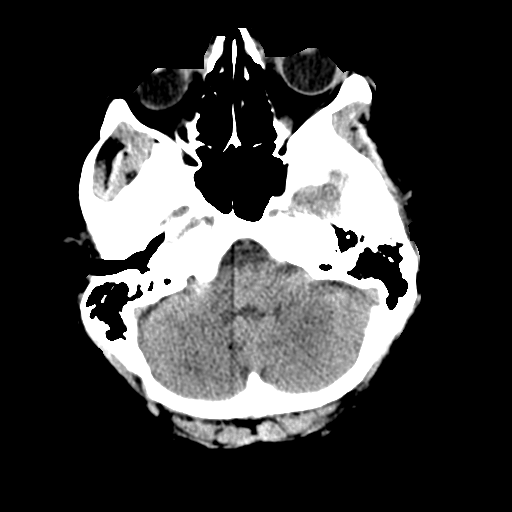
[im 9/32  brain]
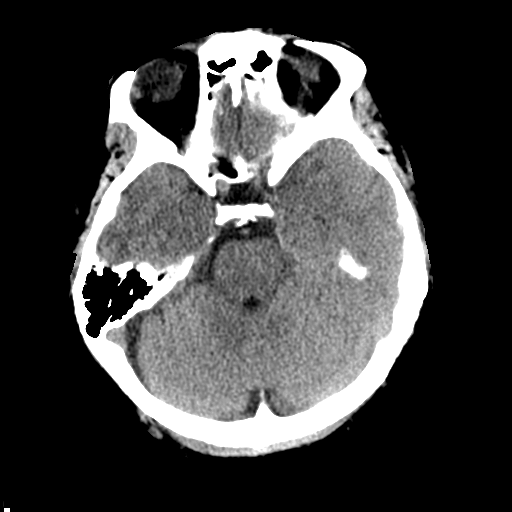
[im 12/32  brain]
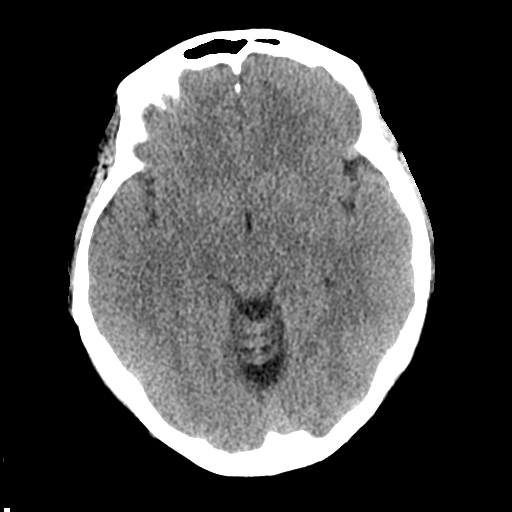
[im 17/32  brain]
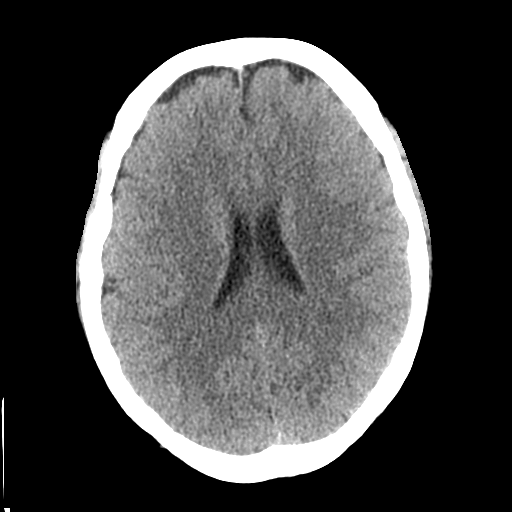
[im 17/32  bone]
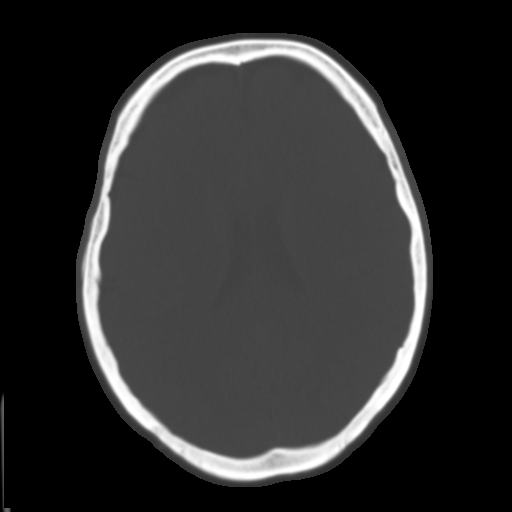
[im 20/32  brain]
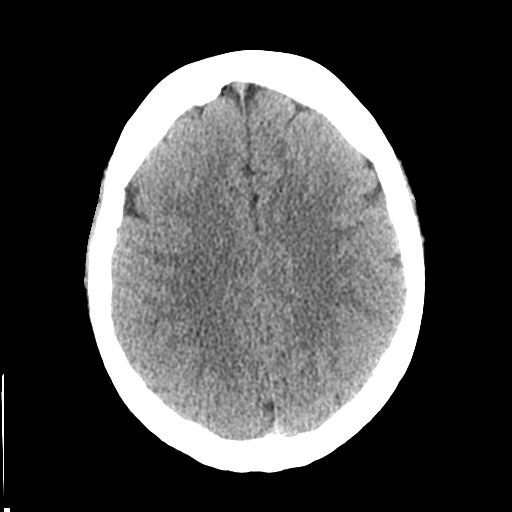
[im 23/32  brain]
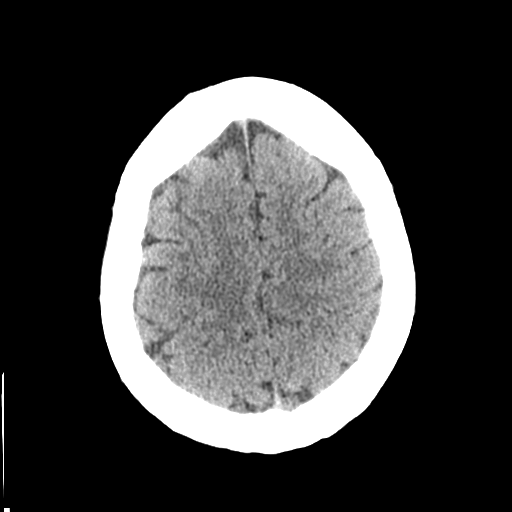
[im 26/32  brain]
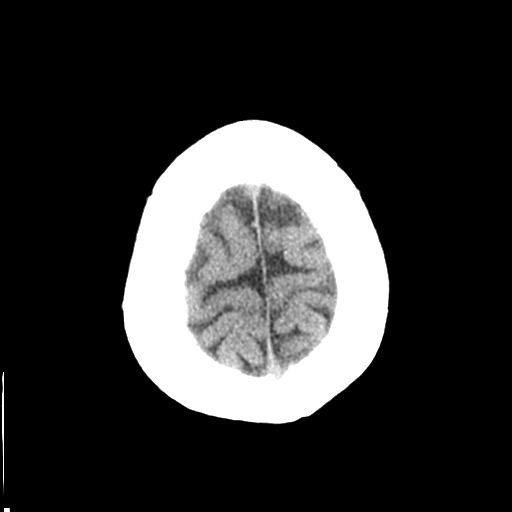
[im 29/32  brain]
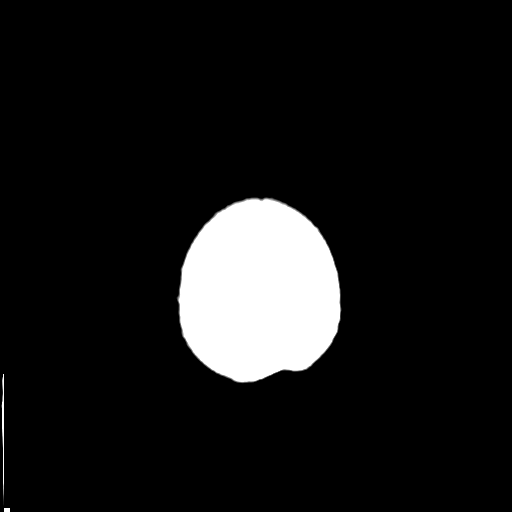
[im 29/32  bone]
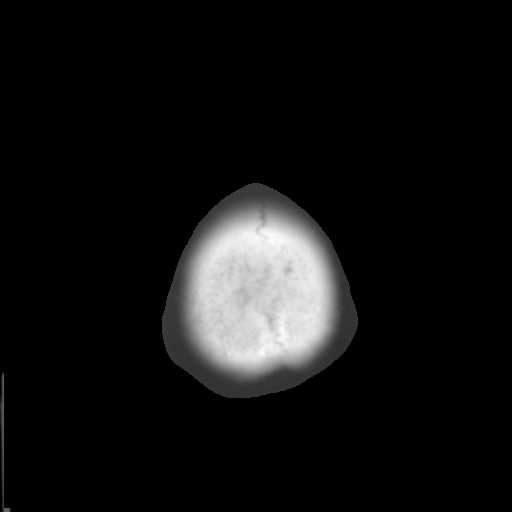

[Series 4: coronal soft · coronal · 0.33mm/px · 3 of 82 slices shown]
[im 28/82  brain]
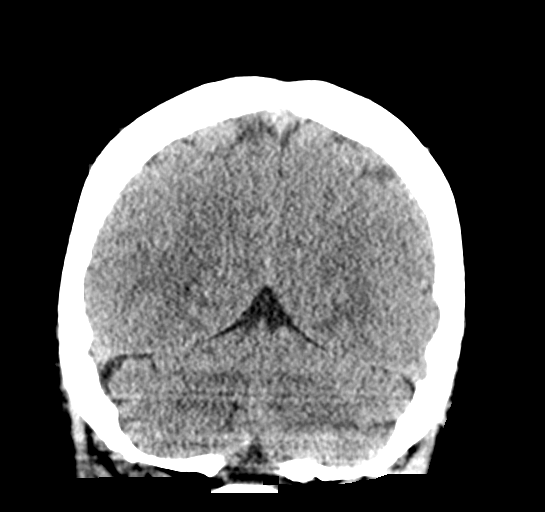
[im 37/82  brain]
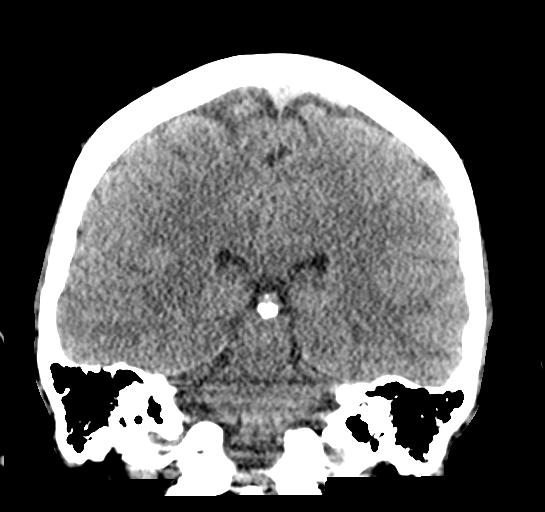
[im 46/82  brain]
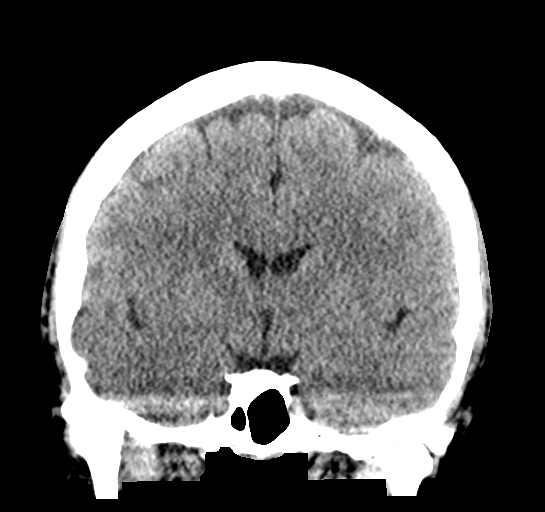

[Series 5: sagittal soft · sagittal · 0.36mm/px · 3 of 64 slices shown]
[im 22/64  brain]
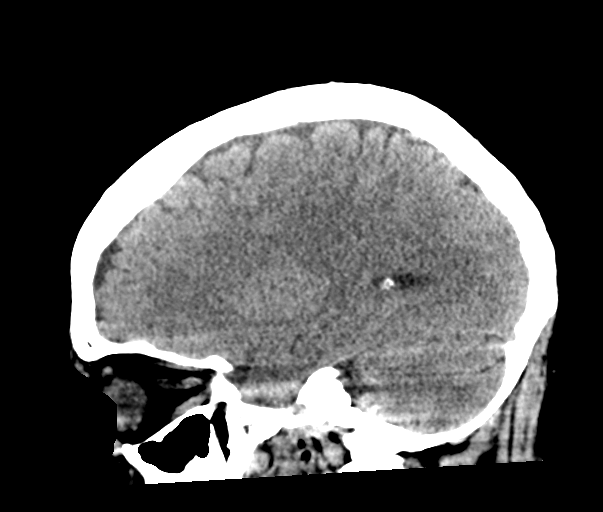
[im 32/64  brain]
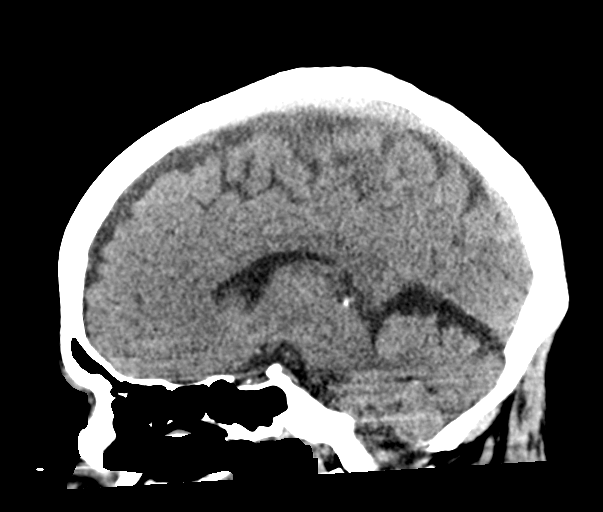
[im 43/64  brain]
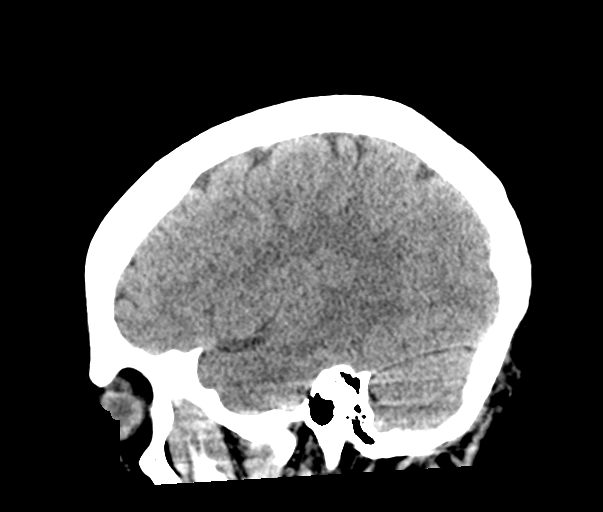

[15 of 47 positions shown; findings below may reference images not displayed]

FINDINGS: CT HEAD FINDINGS

Brain: No evidence of acute infarction, hemorrhage, hydrocephalus,
extra-axial collection or mass lesion/mass effect.

Vascular: No hyperdense vessel or unexpected calcification.

Skull: Normal. Negative for fracture or focal lesion.

Sinuses/Orbits: No acute finding.

Other: None.

CT CERVICAL SPINE FINDINGS

Alignment: No traumatic malalignment.

Skull base and vertebrae: No acute fracture. No primary bone lesion
or focal pathologic process.

Soft tissues and spinal canal: No prevertebral fluid or swelling. No
visible canal hematoma.

Disc levels: Prior C4-C7 ACDF. Residual right greater than left
neuroforaminal stenosis at C6-C7 due to uncovertebral hypertrophy.

Upper chest: Negative.

Other: None.
IMPRESSION: 1. Normal noncontrast head CT.
2. No acute cervical spine fracture or traumatic malalignment.
3. Prior C4-C7 ACDF. Residual right greater than left neuroforaminal
stenosis at C6-C7 due to uncovertebral hypertrophy.

## 2023-01-30 ENCOUNTER — Emergency Department (HOSPITAL_COMMUNITY): Payer: BC Managed Care – PPO

## 2023-01-30 ENCOUNTER — Encounter (HOSPITAL_COMMUNITY): Payer: Self-pay | Admitting: Emergency Medicine

## 2023-01-30 ENCOUNTER — Other Ambulatory Visit: Payer: Self-pay

## 2023-01-30 ENCOUNTER — Emergency Department (HOSPITAL_COMMUNITY)
Admission: EM | Admit: 2023-01-30 | Discharge: 2023-01-30 | Disposition: A | Discharge status: Home / Self Care | Payer: BC Managed Care – PPO | Attending: Emergency Medicine | Admitting: Emergency Medicine

## 2023-01-30 DIAGNOSIS — R1032 Left lower quadrant pain: Secondary | ICD-10-CM | POA: Insufficient documentation

## 2023-01-30 DIAGNOSIS — M545 Low back pain, unspecified: Secondary | ICD-10-CM | POA: Insufficient documentation

## 2023-01-30 DIAGNOSIS — R1030 Lower abdominal pain, unspecified: Secondary | ICD-10-CM

## 2023-01-30 DIAGNOSIS — Z7901 Long term (current) use of anticoagulants: Secondary | ICD-10-CM | POA: Diagnosis not present

## 2023-01-30 DIAGNOSIS — R1031 Right lower quadrant pain: Secondary | ICD-10-CM | POA: Insufficient documentation

## 2023-01-30 LAB — COMPREHENSIVE METABOLIC PANEL
ALT: 18 U/L (ref 0–44)
AST: 18 U/L (ref 15–41)
Albumin: 3.9 g/dL (ref 3.5–5.0)
Alkaline Phosphatase: 54 U/L (ref 38–126)
Anion gap: 8 (ref 5–15)
BUN: 26 mg/dL — ABNORMAL HIGH (ref 6–20)
CO2: 26 mmol/L (ref 22–32)
Calcium: 8.6 mg/dL — ABNORMAL LOW (ref 8.9–10.3)
Chloride: 101 mmol/L (ref 98–111)
Creatinine, Ser: 0.99 mg/dL (ref 0.44–1.00)
GFR, Estimated: >60 mL/min (ref >60)
Glucose, Bld: 105 mg/dL — ABNORMAL HIGH (ref 70–99)
Potassium: 4.1 mmol/L (ref 3.5–5.1)
Sodium: 135 mmol/L (ref 135–145)
Total Bilirubin: 0.6 mg/dL (ref 0.3–1.2)
Total Protein: 7.2 g/dL (ref 6.5–8.1)

## 2023-01-30 LAB — POC URINE PREG, ED: Preg Test, Ur: NEGATIVE

## 2023-01-30 LAB — URINALYSIS, ROUTINE W REFLEX MICROSCOPIC
Bacteria, UA: NONE SEEN
Bilirubin Urine: NEGATIVE
Glucose, UA: NEGATIVE mg/dL
Hgb urine dipstick: NEGATIVE
Ketones, ur: NEGATIVE mg/dL
Nitrite: NEGATIVE
Protein, ur: NEGATIVE mg/dL
Specific Gravity, Urine: 1.009 (ref 1.005–1.030)
pH: 6 (ref 5.0–8.0)

## 2023-01-30 LAB — LIPASE, BLOOD: Lipase: 42 U/L (ref 11–51)

## 2023-01-30 LAB — CBC
HCT: 38 % (ref 36.0–46.0)
Hemoglobin: 12.1 g/dL (ref 12.0–15.0)
MCH: 28.1 pg (ref 26.0–34.0)
MCHC: 31.8 g/dL (ref 30.0–36.0)
MCV: 88.4 fL (ref 80.0–100.0)
Platelets: 174 10*3/uL (ref 150–400)
RBC: 4.3 MIL/uL (ref 3.87–5.11)
RDW: 13.4 % (ref 11.5–15.5)
WBC: 6.2 10*3/uL (ref 4.0–10.5)
nRBC: 0 % (ref 0.0–0.2)

## 2023-01-30 MED ORDER — ONDANSETRON HCL 4 MG/2ML IJ SOLN
4.0000 mg | Freq: Once | INTRAMUSCULAR | Status: AC
  Administered 2023-01-30: 4 mg via INTRAVENOUS
  Filled 2023-01-30: qty 2

## 2023-01-30 MED ORDER — HYDROMORPHONE HCL 1 MG/ML IJ SOLN
0.5000 mg | Freq: Once | INTRAMUSCULAR | Status: AC
  Administered 2023-01-30: 0.5 mg via INTRAVENOUS
  Filled 2023-01-30: qty 0.5

## 2023-01-30 MED ORDER — METHOCARBAMOL 500 MG PO TABS: 500.0000 mg | ORAL_TABLET | Freq: Four times a day (QID) | ORAL | 0 refills | Status: AC | PRN

## 2023-01-30 MED ORDER — HYDROMORPHONE HCL 1 MG/ML IJ SOLN
1.0000 mg | Freq: Once | INTRAMUSCULAR | Status: AC
  Administered 2023-01-30: 1 mg via INTRAVENOUS
  Filled 2023-01-30: qty 1

## 2023-01-30 MED ORDER — KETOROLAC TROMETHAMINE 15 MG/ML IJ SOLN
15.0000 mg | Freq: Once | INTRAMUSCULAR | Status: AC
  Administered 2023-01-30: 15 mg via INTRAVENOUS
  Filled 2023-01-30: qty 1

## 2023-01-30 MED ORDER — SODIUM CHLORIDE 0.9 % IV BOLUS
1000.0000 mL | Freq: Once | INTRAVENOUS | Status: AC
  Administered 2023-01-30: 1000 mL via INTRAVENOUS

## 2023-01-30 MED ORDER — IOHEXOL 300 MG/ML  SOLN
100.0000 mL | Freq: Once | INTRAMUSCULAR | Status: AC | PRN
  Administered 2023-01-30: 100 mL via INTRAVENOUS

## 2023-01-30 NOTE — ED Triage Notes (Signed)
Pt via POV c/o severe lower back pain and lower abdominal pain x 3 days, dx with UTI. Pt has been on keflex for three days with no improvement. Hx torsion, fibroids, and an ablation. Pt is worried that her symptoms may be related to her reproductive system rather than UTI. Pain currently rated 10/10. No other symptoms. Pt took 5-325 norco at 8am this morning with no significant improvement.

## 2023-01-30 NOTE — ED Provider Notes (Signed)
[place] EMERGENCY DEPARTMENT AT [facility] Provider Note   CSN: [id number] Arrival date & time: 01/30/23  1359     History  Chief Complaint  Patient presents with   Abdominal Pain    Katrina Osborne is a 42 y.o. female.  She has PMH of antiphospholipid syndrome.  Presents to the ER complaining of lower abdominal and lower back pain for the past 3 days.  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  Today she is having the same pain and today she was worried that it could be ovarian torsion that she has had torsion in the past.  Denies fever or chills.  She has had some nausea.  No diarrhea, no vaginal complaints.  Denies dysuria.  =   Abdominal Pain      Home Medications Prior to Admission medications   Medication Sig Start Date End Date Taking? Authorizing Provider  apixaban (ELIQUIS) 5 MG TABS tablet Take 5 mg by mouth 2 (two) times daily.   Yes [provider]  Ascorbic Acid (VITAMIN C GUMMIE PO) Take 1 tablet by mouth daily.   Yes [provider]  clobetasol cream (TEMOVATE) 0.05 % Apply 1 Application topically 2 (two) times daily. 02/24/22  Yes [provider]  clonazePAM (KLONOPIN) 1 MG tablet Take 1.5 mg by mouth daily. 01/04/23  Yes [provider]  cyclobenzaprine (FLEXERIL) 5 MG tablet Take 5 mg by mouth daily as needed for muscle spasms.   Yes [provider]  escitalopram (LEXAPRO) 5 MG tablet Take 5 mg by mouth daily. 10/28/19  Yes [provider]  fluticasone (FLONASE) 50 MCG/ACT nasal spray Place 2 sprays into the nose daily as needed for allergies.    Yes [provider]  hydrOXYzine (ATARAX/VISTARIL) 50 MG tablet Take 50 mg by mouth at bedtime. 04/30/21  Yes [provider]  levothyroxine (SYNTHROID) 50 MCG tablet Take 50 mcg by mouth every morning. 09/07/19  Yes [provider]   methocarbamol (ROBAXIN) 500 MG tablet Take 1 tablet (500 mg total) by mouth every 6 (six) hours as needed for muscle spasms. 01/30/23  Yes Shannah Conteh A, PA-C  naloxone (NARCAN) nasal spray 4 mg/0.1 mL Place 1 spray into the nose once. Call 911. May repeat 1 dose in 2 to 3 minutes using a new nasal spray 02/23/17  Yes [provider]  omeprazole (PRILOSEC) 40 MG capsule Take 40 mg by mouth every morning.  01/27/19  Yes [provider]  ondansetron (ZOFRAN ODT) 4 MG disintegrating tablet Take 1 tablet (4 mg total) by mouth every 8 (eight) hours as needed for nausea or vomiting. 01/17/21  Yes Terrilee Files, MD  oxyCODONE-acetaminophen (PERCOCET) 10-325 MG tablet Take 1 tablet by mouth in the morning and at bedtime.   Yes [provider]  PLAQUENIL 200 MG tablet Take 200 mg by mouth 2 (two) times daily with a meal. 02/22/19  Yes [provider]  triamcinolone (KENALOG) 0.025 % cream Apply 1 Application topically 2 (two) times daily. 07/14/17  Yes [provider]      Allergies    Metronidazole, Other, Duloxetine, Morphine and related, Penicillins, Citalopram, Clindamycin, and Reglan [metoclopramide]    Review of Systems   Review of Systems  Gastrointestinal:  Positive for abdominal pain.    Physical Exam Updated Vital Signs BP 125/75   Pulse Marland Kitchen)  56   Temp 98.3 F (36.8 C) (Oral)   Resp 18   Ht 5\' 6"  (1.676 m)   Wt 102.1 kg   SpO2 100%   BMI 36.32 kg/m  Physical Exam Vitals and nursing note reviewed.  Constitutional:      General: She is not in acute distress.    Appearance: She is well-developed.  HENT:     Head: Normocephalic and atraumatic.  Eyes:     Conjunctiva/sclera: Conjunctivae normal.  Cardiovascular:     Rate and Rhythm: Normal rate and regular rhythm.     Heart sounds: No murmur heard. Pulmonary:     Effort: Pulmonary effort is normal. No respiratory distress.     Breath sounds: Normal breath sounds.  Abdominal:      Palpations: Abdomen is soft.     Tenderness: There is abdominal tenderness in the right lower quadrant, suprapubic area and left lower quadrant. There is no right CVA tenderness or left CVA tenderness.  Musculoskeletal:        General: No swelling.     Cervical back: Neck supple.  Skin:    General: Skin is warm and dry.     Capillary Refill: Capillary refill takes less than 2 seconds.  Neurological:     Mental Status: She is alert.  Psychiatric:        Mood and Affect: Mood normal.     ED Results / Procedures / Treatments   Labs (all labs ordered are listed, but only abnormal results are displayed) Labs Reviewed  COMPREHENSIVE METABOLIC PANEL - Abnormal; Notable for the following components:      Result Value   Glucose, Bld 105 (*)    BUN 26 (*)    Calcium 8.6 (*)    All other components within normal limits  URINALYSIS, ROUTINE W REFLEX MICROSCOPIC - Abnormal; Notable for the following components:   Leukocytes,Ua TRACE (*)    All other components within normal limits  LIPASE, BLOOD  CBC  POC URINE PREG, ED    EKG None  Radiology CT ABDOMEN PELVIS W CONTRAST  Result Date: 01/30/2023 CLINICAL DATA:  Lower back and abdominal pain for 3 days. Diagnosed with UTI. EXAM: CT ABDOMEN AND PELVIS WITH CONTRAST TECHNIQUE: Multidetector CT imaging of the abdomen and pelvis was performed using the standard protocol following bolus administration of intravenous contrast. RADIATION DOSE REDUCTION: This exam was performed according to the departmental dose-optimization program which includes automated exposure control, adjustment of the mA and/or kV according to patient size and/or use of iterative reconstruction technique. CONTRAST:  OMNIPAQUE IOHEXOL 300 MG/ML  SOLN COMPARISON:  CT abdomen pelvis 01/17/2021 FINDINGS: Lower chest: No acute abnormality. Hepatobiliary: No focal liver abnormality is seen. Status post cholecystectomy. Stable mild intra extrahepatic biliary duct dilation  following cholecystectomy. Pancreas: Unremarkable. No pancreatic ductal dilatation or surrounding inflammatory changes. Spleen: Normal in size without focal abnormality. Adrenals/Urinary Tract: Adrenal glands are unremarkable. Kidneys are normal, without renal calculi, focal lesion, or hydronephrosis. Bladder is unremarkable. Stomach/Bowel: Stomach is within normal limits. Appendix not visualized. No evidence of bowel wall thickening, distention, or inflammatory changes. Vascular/Lymphatic: No significant vascular findings are present. No enlarged abdominal or pelvic lymph nodes. Reproductive: Uterus and bilateral adnexa are unremarkable. Other: No abdominal wall hernia or abnormality. No abdominopelvic ascites. Musculoskeletal: No acute or significant osseous findings. IMPRESSION: No acute finding in the abdomen or pelvis. Electronically Signed   By: Emmaline Kluver M.D.   On: 01/30/2023 18:58   US Pelvis Complete  Result Date: 01/30/2023 CLINICAL DATA:  Pain.  Previous endometrial ablation. EXAM: TRANSABDOMINAL AND TRANSVAGINAL ULTRASOUND OF PELVIS DOPPLER ULTRASOUND OF OVARIES TECHNIQUE: Both transabdominal and transvaginal ultrasound examinations of the pelvis were performed. Transabdominal technique was performed for global imaging of the pelvis including uterus, ovaries, adnexal regions, and pelvic cul-de-sac. It was necessary to proceed with endovaginal exam following the transabdominal exam to visualize the endometrium and ovaries. Color and duplex Doppler ultrasound was utilized to evaluate blood flow to the ovaries. COMPARISON:  None Available. FINDINGS: Uterus Measurements: 7.5 x 4.3 x 4.5 cm = volume: 76 mL. No fibroids or other mass visualized. Endometrium A 1.9 x 1.3 x 1.6 cm mass is measured within the uterus. It is unclear whether this mass is associated with the endometrium/endometrial canal or within the sub endometrial region of the myometrium. Right ovary Measurements: 2.7 x 1.3 x 3.1 cm =  volume: 5.8 mL. Contains a 2.2 cm follicle. No follow-up imaging recommended for the follicle. Left ovary Measurements: 1.3 x 0.9 x 1.4 cm = volume: 0.9 mL. Limited evaluation, only identified transabdominally. Pulsed Doppler evaluation of both ovaries demonstrates normal low-resistance arterial and venous waveforms. Other findings No abnormal free fluid. IMPRESSION: 1. The right ovary is normal in appearance with no cause for pain identified. There is a dominant 2.2 cm follicle in the right ovary. No follow-up imaging is recommended for the follicle. 2. The left ovary is poorly visualized, only seen with transabdominal imaging. No obvious abnormality on limited images. 3. A 1.9 cm mass in the uterus is identified. On provided images, it is unclear whether this represents a sub endometrial fibroid or a mass associated with the endometrium/endometrial canal. This is likely an incidental finding and not associated with the patient's pain. Recommend gynecologic consultation as an outpatient. A sonohysterogram may better evaluate the location of this mass. Electronically Signed   By: Gerome Sam III M.D.   On: 01/30/2023 16:21   US Transvaginal Non-OB  Result Date: 01/30/2023 CLINICAL DATA:  Pain.  Previous endometrial ablation. EXAM: TRANSABDOMINAL AND TRANSVAGINAL ULTRASOUND OF PELVIS DOPPLER ULTRASOUND OF OVARIES TECHNIQUE: Both transabdominal and transvaginal ultrasound examinations of the pelvis were performed. Transabdominal technique was performed for global imaging of the pelvis including uterus, ovaries, adnexal regions, and pelvic cul-de-sac. It was necessary to proceed with endovaginal exam following the transabdominal exam to visualize the endometrium and ovaries. Color and duplex Doppler ultrasound was utilized to evaluate blood flow to the ovaries. COMPARISON:  None Available. FINDINGS: Uterus Measurements: 7.5 x 4.3 x 4.5 cm = volume: 76 mL. No fibroids or other mass visualized. Endometrium A 1.9  x 1.3 x 1.6 cm mass is measured within the uterus. It is unclear whether this mass is associated with the endometrium/endometrial canal or within the sub endometrial region of the myometrium. Right ovary Measurements: 2.7 x 1.3 x 3.1 cm = volume: 5.8 mL. Contains a 2.2 cm follicle. No follow-up imaging recommended for the follicle. Left ovary Measurements: 1.3 x 0.9 x 1.4 cm = volume: 0.9 mL. Limited evaluation, only identified transabdominally. Pulsed Doppler evaluation of both ovaries demonstrates normal low-resistance arterial and venous waveforms. Other findings No abnormal free fluid. IMPRESSION: 1. The right ovary is normal in appearance with no cause for pain identified. There is a dominant 2.2 cm follicle in the right ovary. No follow-up imaging is recommended for the follicle. 2. The left ovary is poorly visualized, only seen with transabdominal imaging. No obvious abnormality on limited images. 3. A 1.9 cm mass  in the uterus is identified. On provided images, it is unclear whether this represents a sub endometrial fibroid or a mass associated with the endometrium/endometrial canal. This is likely an incidental finding and not associated with the patient's pain. Recommend gynecologic consultation as an outpatient. A sonohysterogram may better evaluate the location of this mass. Electronically Signed   By: Gerome Sam III M.D.   On: 01/30/2023 16:21   [place] Art/Ven Flow Abd Pelv Doppler  Result Date: 01/30/2023 CLINICAL DATA:  Pain.  Previous endometrial ablation. EXAM: TRANSABDOMINAL AND TRANSVAGINAL ULTRASOUND OF PELVIS DOPPLER ULTRASOUND OF OVARIES TECHNIQUE: Both transabdominal and transvaginal ultrasound examinations of the pelvis were performed. Transabdominal technique was performed for global imaging of the pelvis including uterus, ovaries, adnexal regions, and pelvic cul-de-sac. It was necessary to proceed with endovaginal exam following the transabdominal exam to visualize the endometrium and  ovaries. Color and duplex Doppler ultrasound was utilized to evaluate blood flow to the ovaries. COMPARISON:  None Available. FINDINGS: Uterus Measurements: 7.5 x 4.3 x 4.5 cm = volume: 76 mL. No fibroids or other mass visualized. Endometrium A 1.9 x 1.3 x 1.6 cm mass is measured within the uterus. It is unclear whether this mass is associated with the endometrium/endometrial canal or within the sub endometrial region of the myometrium. Right ovary Measurements: 2.7 x 1.3 x 3.1 cm = volume: 5.8 mL. Contains a 2.2 cm follicle. No follow-up imaging recommended for the follicle. Left ovary Measurements: 1.3 x 0.9 x 1.4 cm = volume: 0.9 mL. Limited evaluation, only identified transabdominally. Pulsed Doppler evaluation of both ovaries demonstrates normal low-resistance arterial and venous waveforms. Other findings No abnormal free fluid. IMPRESSION: 1. The right ovary is normal in appearance with no cause for pain identified. There is a dominant 2.2 cm follicle in the right ovary. No follow-up imaging is recommended for the follicle. 2. The left ovary is poorly visualized, only seen with transabdominal imaging. No obvious abnormality on limited images. 3. A 1.9 cm mass in the uterus is identified. On provided images, it is unclear whether this represents a sub endometrial fibroid or a mass associated with the endometrium/endometrial canal. This is likely an incidental finding and not associated with the patient's pain. Recommend gynecologic consultation as an outpatient. A sonohysterogram may better evaluate the location of this mass. Electronically Signed   By: Gerome Sam III M.D.   On: 01/30/2023 16:21    Procedures Procedures    Medications Ordered in ED Medications  HYDROmorphone (DILAUDID) injection 0.5 mg (0.5 mg Intravenous Given 01/30/23 1504)  ondansetron (ZOFRAN) injection 4 mg (4 mg Intravenous Given 01/30/23 1504)  HYDROmorphone (DILAUDID) injection 1 mg (1 mg Intravenous Given 01/30/23 1712)   sodium chloride 0.9 % bolus 1,000 mL (1,000 mLs Intravenous Bolus 01/30/23 1712)  iohexol (OMNIPAQUE) 300 MG/ML solution 100 mL (100 mLs Intravenous Contrast Given 01/30/23 1809)  ketorolac (TORADOL) 15 MG/ML injection 15 mg (15 mg Intravenous Given 01/30/23 1926)    ED Course/ Medical Decision Making/ A&P                             Medical Decision Making This patient presents to the ED for concern of lower back and lower abdominal pain for 3 days, this involves an extensive number of treatment options, and is a complaint that carries with it a high risk of complications and morbidity.  The differential diagnosis includes ovarian torsion, tubo-ovarian ovarian abscess, appendicitis, UTI, other  Co morbidities that complicate the patient evaluation  History of ovarian torsion, antiphospholipid syndrome on Eliquis   Additional history obtained:  Additional history obtained from EMR External records from outside source obtained and reviewed including urgent care visit from 2 days ago with urinalysis showing trace leukocytes   Lab Tests:  I Ordered, and personally interpreted labs.  The pertinent results include: CBC is normal, CMP shows mildly increased BUN and otherwise reassuring, urinalysis shows again trace leukocyte Estrace, patient is not pregnant, lipase normal   Imaging Studies ordered:  I ordered imaging studies including CT abdomen pelvis, pelvic ultrasound I independently visualized and interpreted imaging which showed CT pelvis showed no acute findings, pelvic ultrasound read by radiology shows a 1.9 cm mass of the uterus possible fibroid versus endometrial mass.  Patient has history of fibroids I agree with the radiologist interpretation     Problem List / ED Course / Critical interventions / Medication management  Here for 3 days of lower abdominal and pelvic pain.  Reports history of fibroids and ovarian torsion and wanted further evaluation with pelvic ultrasound  which was normal did show the 1.9 cm mass that could be fibroid versus endometrial mass.  Discussed with patient that this likely represents the known fibroid she has but she is going to follow-up with her gynecologist closely for this.  Given pain medication with some relief.  CT ordered to rule out additional intra-abdominal pathology and this was normal as well.  She is having some low back pain discussed this could be musculoskeletal in nature.  She is on Eliquis avoid NSAIDs.  I reviewed the PMP and see that she filled 60 hydrocodone within the past week so we will not prescribe any further narcotics but will give her some muscle relaxers.  Advised on close PCP follow-up and strict return precautions.  She has reassuring vitals and labs and feels she is stable for discharge. I ordered medication including Toradol and and Dilaudid for pain, Zofran for nausea Reevaluation of the patient after these medicines showed that the patient improved I have reviewed the patients home medicines and have made adjustments as needed       Amount and/or Complexity of Data Reviewed Labs: ordered. Radiology: ordered.  Risk Prescription drug management.           Final Clinical Impression(s) / ED Diagnoses Final diagnoses:  Lower abdominal pain    Rx / DC Orders ED Discharge Orders          Ordered    methocarbamol (ROBAXIN) 500 MG tablet  Every 6 hours PRN        01/30/23 2000              Josem Kaufmann 01/30/23 2008    Bethann Berkshire, MD 01/31/23 1600

## 2023-01-30 NOTE — Discharge Instructions (Addendum)
Seen today for lower abdominal and pelvic pain.  Your blood work was very reassuring.  There is no signs of a UTI.  Your white blood cell count was normal, your electrolytes were normal.  You were given fluids and pain medication.  Your pelvic ultrasound and the CT scan were reassuring.  Follow-up with your PCP and gynecology.  He can use medications as prescribed for pain.  Prescribed muscle relaxers.  Your record shows that you picked up 60 hydrocodone within the past week, so you can use these as well for your discomfort.  Come back to the ER for new or worsening symptoms.

## 2023-04-01 ENCOUNTER — Ambulatory Visit
Admission: RE | Admit: 2023-04-01 | Discharge: 2023-04-01 | Disposition: A | Discharge status: Home / Self Care | Payer: BC Managed Care – PPO | Source: Ambulatory Visit | Attending: Family Medicine | Admitting: Family Medicine

## 2023-04-01 ENCOUNTER — Other Ambulatory Visit: Payer: Self-pay | Admitting: Family Medicine

## 2023-04-01 DIAGNOSIS — G8929 Other chronic pain: Secondary | ICD-10-CM

## 2024-03-07 ENCOUNTER — Other Ambulatory Visit: Payer: Self-pay | Admitting: Family Medicine

## 2024-03-07 DIAGNOSIS — Z1231 Encounter for screening mammogram for malignant neoplasm of breast: Secondary | ICD-10-CM

## 2024-03-15 ENCOUNTER — Ambulatory Visit
Admission: RE | Admit: 2024-03-15 | Discharge: 2024-03-15 | Disposition: A | Discharge status: Home / Self Care | Source: Ambulatory Visit | Attending: Family Medicine | Admitting: Family Medicine

## 2024-03-15 DIAGNOSIS — Z1231 Encounter for screening mammogram for malignant neoplasm of breast: Secondary | ICD-10-CM

## 2024-03-20 ENCOUNTER — Other Ambulatory Visit: Payer: Self-pay | Admitting: Family Medicine

## 2024-03-20 DIAGNOSIS — R928 Other abnormal and inconclusive findings on diagnostic imaging of breast: Secondary | ICD-10-CM

## 2024-03-21 ENCOUNTER — Inpatient Hospital Stay
Admission: RE | Admit: 2024-03-21 | Discharge: 2024-03-21 | Discharge status: Home / Self Care | Source: Ambulatory Visit | Attending: Family Medicine | Admitting: Family Medicine

## 2024-03-21 DIAGNOSIS — R928 Other abnormal and inconclusive findings on diagnostic imaging of breast: Secondary | ICD-10-CM

## 2024-03-31 ENCOUNTER — Other Ambulatory Visit

## 2024-03-31 ENCOUNTER — Encounter

## 2024-04-07 NOTE — Progress Notes (Signed)
 [facility] [place]  Patient Information  Location Information: Patient State (at time of visit): [place]  Patient Location (at time of visit):Home/Other Non-Medical  Provider Location: Home Is provider licensed to provide clinical care in the current location/state of the patient? Yes  Consent  Consent:  Patient's identity was confirmed. Presenting condition or illness was discussed with the patient/personal representative. Current proposed treatment for presenting condition or illness was explained to patient/personal representative along with the likely benefits and any significant risks or complications associated with the provision of treatment by audio/video means. The patient/personal representative verbally authorized treatment to be provided by audio/video, which may include a limited review of patient's current health status, medication, or other treatment recommendations, patient education, and an opportunity to ask questions about condition and treatment. Verbal Consent Granted by Patient/Personal Representative:Yes   Visit Information: Video start/stop time: Start time: 04/07/2024 11:21 AM EDT End time: 04/07/2024 11:30 AM EDT  Video duration: 55m 49s  History of Present Illness   History of Present Illness The patient is a 43 year old female presenting via virtual visit for back pain. She has a history of chronic back pain and has undergone back surgery.  Two days ago, she experienced a fall in the shower, which exacerbated her existing back pain. The pain is so severe that it has disrupted her sleep and appetite. She describes the pain as being located in the center of her lumbar area. She also reports sciatica down her right leg, which is more severe than usual. She reports no loss of bowel or bladder function. She is currently following with pain management and takes her prescribed pain medications, but they have not been  effective. She has tried various treatments including Flexeril  10 mg, ibuprofen, Tylenol , ice, heat, warm baths, showers, and different positions on the couch and bed, but none have provided relief.   She has missed work due to the pain and is scheduled to return on 04/11/2024. She has lost 68 pounds to reduce strain on her back and knees. She has a history of degenerative disc disease and spondylosis. 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 She has an appointment with her pain management doctor on 04/10/2024 which is the soonest they could see her. She is currently using ice for relief. She needs a fusion but is considered a high-risk patient for surgeries. She underwent back surgery around 2017-2018.  Social History: Occupations: Radiation protection practitioner Diet: Healthy diet, lost 68 pounds, consumes decaf coffee with protein shake and zero sugar creamers  Video Exam  Physical Exam Constitutional:      Appearance: Normal appearance.     Comments: Grimacing in pain, patient in discomfort.  HENT:     Nose: Nose normal. No rhinorrhea.   Eyes:     General:        Right eye: No discharge.        Left eye: No discharge.     Extraocular Movements: Extraocular movements intact.     Conjunctiva/sclera: Conjunctivae normal.   Pulmonary:     Effort: Pulmonary effort is normal. No respiratory distress.     Breath sounds: No stridor.     Comments: Able to speak in fluid sentences with no gasping.  No audible wheeze.  Skin:    Coloration: Skin is not jaundiced or pale.     Findings: No erythema or rash.   Neurological:     General: No focal deficit present.  Mental Status: She is alert and oriented to person, place, and time.   Psychiatric:        Mood and Affect: Mood normal.        Behavior: Behavior normal.        Thought Content: Thought content normal.    Diagnosis and Medical Decision Making  1. Chronic midline low back pain with right-sided sciatica (Primary) -  methylPREDNISolone (Medrol, Pak,) 4 mg 6 day dose pack; Take As Directed On Package  Dispense: 21 tablet; Refill: 0   Assessment & Plan 1-2. Chronic back pain: Stable. Recent spine imaging on 08/14/2023 showed moderate to severe multilevel degenerative disk and joint disease, most significant at L3-4 with mild to moderate neuroforaminal stenosis. - Prescribed a 6-day course of steroids starting with six pills on the first day, decreasing by one pill each day. - Advised to avoid ibuprofen or other anti-inflammatories during this period but may continue with Tylenol . - Encouraged to maintain regular exercise and a healthy diet. - If pain becomes severe and unmanageable, advised to seek immediate medical attention at the emergency department. - Follow up with pain management.  Follow-up - A work note will be provided for the days missed due to back pain. - Follow up with pain management.  Disposition  If condition(s) worsens, or not improving Nonnie needs re-evaluation Disposition:  Patient to continue care at home.

## 2024-04-08 ENCOUNTER — Emergency Department (HOSPITAL_COMMUNITY)

## 2024-04-08 ENCOUNTER — Emergency Department (HOSPITAL_COMMUNITY)
Admission: EM | Admit: 2024-04-08 | Discharge: 2024-04-08 | Disposition: A | Discharge status: Home / Self Care | Attending: Emergency Medicine | Admitting: Emergency Medicine

## 2024-04-08 ENCOUNTER — Other Ambulatory Visit: Payer: Self-pay

## 2024-04-08 ENCOUNTER — Encounter (HOSPITAL_COMMUNITY): Payer: Self-pay | Admitting: *Deleted

## 2024-04-08 DIAGNOSIS — M5441 Lumbago with sciatica, right side: Secondary | ICD-10-CM | POA: Insufficient documentation

## 2024-04-08 DIAGNOSIS — W1840XA Slipping, tripping and stumbling without falling, unspecified, initial encounter: Secondary | ICD-10-CM | POA: Insufficient documentation

## 2024-04-08 DIAGNOSIS — M545 Low back pain, unspecified: Secondary | ICD-10-CM | POA: Diagnosis present

## 2024-04-08 DIAGNOSIS — Y92480 Sidewalk as the place of occurrence of the external cause: Secondary | ICD-10-CM | POA: Insufficient documentation

## 2024-04-08 DIAGNOSIS — Z7901 Long term (current) use of anticoagulants: Secondary | ICD-10-CM | POA: Diagnosis not present

## 2024-04-08 LAB — URINALYSIS, ROUTINE W REFLEX MICROSCOPIC
Bilirubin Urine: NEGATIVE
Glucose, UA: NEGATIVE mg/dL
Hgb urine dipstick: NEGATIVE
Ketones, ur: NEGATIVE mg/dL
Leukocytes,Ua: NEGATIVE
Nitrite: NEGATIVE
Protein, ur: NEGATIVE mg/dL
Specific Gravity, Urine: 1.006 (ref 1.005–1.030)
pH: 6 (ref 5.0–8.0)

## 2024-04-08 MED ORDER — ONDANSETRON HCL 4 MG/2ML IJ SOLN
4.0000 mg | Freq: Once | INTRAMUSCULAR | Status: AC
  Administered 2024-04-08: 4 mg via INTRAVENOUS
  Filled 2024-04-08: qty 2

## 2024-04-08 MED ORDER — HYDROMORPHONE HCL 1 MG/ML IJ SOLN
1.0000 mg | Freq: Once | INTRAMUSCULAR | Status: AC
  Administered 2024-04-08: 1 mg via INTRAVENOUS
  Filled 2024-04-08: qty 1

## 2024-04-08 MED ORDER — OXYCODONE-ACETAMINOPHEN 5-325 MG PO TABS: 1.0000 | ORAL_TABLET | Freq: Four times a day (QID) | ORAL | 0 refills | Status: AC | PRN

## 2024-04-08 MED ORDER — OXYCODONE-ACETAMINOPHEN 5-325 MG PO TABS
1.0000 | ORAL_TABLET | Freq: Once | ORAL | Status: AC
  Administered 2024-04-08: 1 via ORAL
  Filled 2024-04-08: qty 1

## 2024-04-08 MED ORDER — SODIUM CHLORIDE 0.9 % IV BOLUS
500.0000 mL | Freq: Once | INTRAVENOUS | Status: AC
  Administered 2024-04-08: 500 mL via INTRAVENOUS

## 2024-04-08 NOTE — ED Triage Notes (Signed)
 Pt c/o severe lower back pain x 2 days; pt thinks she may have injured her back but is uncertain of the specific time Pt states she is on pain management and takes hydrocodone with her last dose at 0730 this am with no relief  Pt states she has been using muscle relaxer with no relief

## 2024-04-08 NOTE — Discharge Instructions (Signed)
 Continue your muscle relaxers and steroid as directed until finished.  You may also try over-the-counter 4% lidocaine patches applied to the affected area.  Please keep your upcoming appointment with your pain management provider.  You have been given a small quantity of Percocet to take as directed for the next few days.  Please discontinue your hydrocodone while taking the Percocet.  This may cause drowsiness so do not operate machinery or drive while taking the medication.

## 2024-04-08 NOTE — ED Notes (Signed)
 Pt given prescription for oxycodone  for [facility] to dispense. Prescription was printed on backside of paper accidentally, but signed by myself, second RN, ordering provider, and pt. Pt given to-go pack of medication with all name, date, and instructions written on the bottle. Pt given discharge instructions regarding safe medication administration. Voiced no concerns at this time.

## 2024-04-08 NOTE — ED Notes (Signed)
 Patient transported to MRI

## 2024-04-08 NOTE — ED Provider Notes (Signed)
 [facility] AT [facility] Provider Note   CSN: [id number] Arrival date & time: 04/08/24  1039     Patient presents with: Back Pain   Katrina Osborne is a 43 y.o. female.  {Add pertinent medical, surgical, social history, OB history to HPI:32947}  Back Pain      Prior to Admission medications   Medication Sig Start Date End Date Taking? Authorizing Provider  apixaban (ELIQUIS) 5 MG TABS tablet Take 5 mg by mouth 2 (two) times daily.    [provider]  Ascorbic Acid (VITAMIN C GUMMIE PO) Take 1 tablet by mouth daily.    [provider]  clobetasol cream (TEMOVATE) 0.05 % Apply 1 Application topically 2 (two) times daily. 02/24/22   [provider]  clonazePAM (KLONOPIN) 1 MG tablet Take 1.5 mg by mouth daily. 01/04/23   [provider]  cyclobenzaprine  (FLEXERIL ) 5 MG tablet Take 5 mg by mouth daily as needed for muscle spasms.    [provider]  escitalopram (LEXAPRO) 5 MG tablet Take 5 mg by mouth daily. 10/28/19   [provider]  fluticasone (FLONASE) 50 MCG/ACT nasal spray Place 2 sprays into the nose daily as needed for allergies.     [provider]  hydrOXYzine (ATARAX/VISTARIL) 50 MG tablet Take 50 mg by mouth at bedtime. 04/30/21   [provider]  levothyroxine (SYNTHROID) 50 MCG tablet Take 50 mcg by mouth every morning. 09/07/19   [provider]  methocarbamol  (ROBAXIN ) 500 MG tablet Take 1 tablet (500 mg total) by mouth every 6 (six) hours as needed for muscle spasms. 01/30/23   Suellen Cantor A, PA-C  naloxone [facility]) nasal spray 4 mg/0.1 mL Place 1 spray into the nose once. Call 911. May repeat 1 dose in 2 to 3 minutes using a new nasal spray 02/23/17   [provider]  omeprazole (PRILOSEC) 40 MG capsule Take 40 mg by mouth every morning.  01/27/19   [provider]  ondansetron  (ZOFRAN  ODT) 4 MG disintegrating tablet Take 1 tablet (4 mg total) by  mouth every 8 (eight) hours as needed for nausea or vomiting. 01/17/21   Towana Ozell BROCKS, MD  oxyCODONE -acetaminophen  (PERCOCET) 10-325 MG tablet Take 1 tablet by mouth in the morning and at bedtime.    [provider]  PLAQUENIL 200 MG tablet Take 200 mg by mouth 2 (two) times daily with a meal. 02/22/19   [provider]  triamcinolone (KENALOG) 0.025 % cream Apply 1 Application topically 2 (two) times daily. 07/14/17   [provider]    Allergies: Metronidazole, Other, Duloxetine, Morphine and codeine, Penicillins, Citalopram, Clindamycin , and Reglan  [metoclopramide ]    Review of Systems  Musculoskeletal:  Positive for back pain.    Updated Vital Signs BP (!) 97/59   Pulse (!) 58   Temp 98 F (36.7 C) (Oral)   Resp 16   Ht 5' 5 (1.651 m)   Wt 97.5 kg   SpO2 100%   BMI 35.78 kg/m   Physical Exam  (all labs ordered are listed, but only abnormal results are displayed) Labs Reviewed  URINALYSIS, ROUTINE W REFLEX MICROSCOPIC    EKG: None  Radiology: MR LUMBAR SPINE WO CONTRAST Result Date: 04/08/2024 CLINICAL DATA:  Provided history: Low back pain, cauda equina syndrome suspected. EXAM: MRI LUMBAR SPINE WITHOUT CONTRAST TECHNIQUE: Multiplanar, multisequence MR imaging of the lumbar spine was performed. No intravenous contrast was administered. COMPARISON:  CT abdomen/pelvis 01/30/2023. FINDINGS: Segmentation: 5  lumbar vertebrae. The caudal most well-formed intervertebral disc space is designated L5-S1. Alignment: Levocurvature of the lumbar spine. 2 mm L2-L3 grade 1 retrolisthesis. Vertebrae: No lumbar vertebral compression fracture. Small Schmorl node within the L5 superior endplate. No significant marrow edema or focal worrisome marrow lesion. Conus medullaris and cauda equina: Conus extends to the L1-L2 level. No signal abnormality identified within the visualized distal spinal cord. Paraspinal and other soft tissues: No acute finding within included  portions of the abdomen/retroperitoneum. No paraspinal mass or collection. Disc levels: Multilevel disc degeneration, greatest at L3-L4 and L4-L5 (moderate to advanced at these levels). T11-T12: No significant disc herniation or stenosis. T12-L1: No significant disc herniation or stenosis. L1-L2: Disc bulge. Superimposed broad-based right subarticular/foraminal disc protrusion. The disc protrusion results in moderate right subarticular stenosis with potential to affect the descending right L2 nerve root (series 8, image 13). No significant central canal stenosis. Mild relative right neural foraminal narrowing. L2-L3: Mild grade 1 retrolisthesis. Disc bulge. Superimposed broad-based right subarticular/foraminal disc protrusion. Moderate facet arthropathy with ligamentum flavum hypertrophy. Moderate right subarticular stenosis with potential to affect the descending right L3 nerve root (series 8, image 19). No significant central canal stenosis. Mild relative right neural foraminal narrowing. L3-L4: Post-laminectomy changes. Disc bulge with endplate osteophytes. Facet arthropathy (greater on the right and mild-to-moderate on the right). Slight ligamentum flavum hypertrophy on the right). No significant spinal canal stenosis. Prominent right-sided disc osteophyte ridge contributes to multifactorial moderate right neural foraminal narrowing. Mild left neural foraminal and also present at this level. L4-L5: Post-laminectomy changes. Disc bulge with endplate spurring. Mild facet arthropathy. Mild ligamentum flavum hypertrophy on the right. No significant spinal canal stenosis. Bilateral neural foraminal narrowing (mild right, mild-to-moderate left). L5-S1: Disc bulge. Mild facet arthropathy. Mild ligamentum flavum thickening. No significant spinal canal stenosis. Mild-to-moderate left neural from narrowing. IMPRESSION: 1. Lumbar spondylosis and post-operative changes as outlined within the body of the report. 2. At L1-L2,  a broad-based right subarticular/foraminal disc protrusion contributes to moderate right subarticular stenosis with potential to affect the descending right L2 nerve root. 3. At L3-L4, a broad-based right subarticular/foraminal disc protrusion contributes to moderate right subarticular stenosis with potential to affect the descending right L3 nerve root). 4. No significant spinal canal stenosis at the remaining levels. 5. Multilevel foraminal stenosis, greatest on the right at L3-L4 (moderate), on the left at L4-L5 (mild-to-moderate) and on the left at L5-S1 (mild-to-moderate). 6. Levocurvature of the lumbar spine. Electronically Signed   By: Rockey Childs D.O.   On: 04/08/2024 13:18    {Document cardiac monitor, telemetry assessment procedure when appropriate:32947} Procedures   Medications Ordered in the ED  HYDROmorphone  (DILAUDID ) injection 1 mg (1 mg Intravenous Given 04/08/24 1124)  ondansetron  (ZOFRAN ) injection 4 mg (4 mg Intravenous Given 04/08/24 1123)  HYDROmorphone  (DILAUDID ) injection 1 mg (1 mg Intravenous Given 04/08/24 1225)  sodium chloride  0.9 % bolus 500 mL (0 mLs Intravenous Stopped 04/08/24 1259)  oxyCODONE -acetaminophen  (PERCOCET/ROXICET) 5-325 MG per tablet 1 tablet (1 tablet Oral Given 04/08/24 1407)  sodium chloride  0.9 % bolus 500 mL (500 mLs Intravenous New Bag/Given 04/08/24 1441)      {Click here for ABCD2, HEART and other calculators REFRESH Note before signing:1}                              Medical Decision Making Amount and/or Complexity of Data Reviewed Labs: ordered. Radiology: ordered.  Risk Prescription drug management.   ***  {  Document critical care time when appropriate  Document review of labs and clinical decision tools ie CHADS2VASC2, etc  Document your independent review of radiology images and any outside records  Document your discussion with family members, caretakers and with consultants  Document social determinants of health affecting pt's  care  Document your decision making why or why not admission, treatments were needed:32947:::1}   Final diagnoses:  None    ED Discharge Orders     None

## 2024-04-10 MED FILL — Oxycodone w/ Acetaminophen Tab 5-325 MG: ORAL | Qty: 6 | Status: AC
